# Patient Record
Sex: Male | Born: 1975 | Race: White | Hispanic: No | Marital: Single | Smoking: Light tobacco smoker
Health system: Southern US, Community
[De-identification: ages and names within clinical notes are randomized; demographics above are authoritative.]

## PROBLEM LIST (undated history)

## (undated) DIAGNOSIS — F17200 Nicotine dependence, unspecified, uncomplicated: Secondary | ICD-10-CM

## (undated) DIAGNOSIS — S82143A Displaced bicondylar fracture of unspecified tibia, initial encounter for closed fracture: Secondary | ICD-10-CM

## (undated) DIAGNOSIS — E349 Endocrine disorder, unspecified: Secondary | ICD-10-CM

## (undated) DIAGNOSIS — F1910 Other psychoactive substance abuse, uncomplicated: Secondary | ICD-10-CM

## (undated) DIAGNOSIS — E611 Iron deficiency: Secondary | ICD-10-CM

## (undated) DIAGNOSIS — E559 Vitamin D deficiency, unspecified: Secondary | ICD-10-CM

## (undated) HISTORY — PX: FRACTURE SURGERY: SHX138

---

## 1998-09-01 ENCOUNTER — Inpatient Hospital Stay (HOSPITAL_COMMUNITY): Admission: EM | Admit: 1998-09-01 | Discharge: 1998-09-16 | Payer: Self-pay | Admitting: Emergency Medicine

## 1998-09-01 ENCOUNTER — Encounter: Payer: Self-pay | Admitting: General Surgery

## 1998-09-01 ENCOUNTER — Encounter: Payer: Self-pay | Admitting: Emergency Medicine

## 1998-09-02 ENCOUNTER — Encounter: Payer: Self-pay | Admitting: General Surgery

## 1998-09-06 ENCOUNTER — Encounter: Payer: Self-pay | Admitting: General Surgery

## 1998-09-09 ENCOUNTER — Encounter: Payer: Self-pay | Admitting: General Surgery

## 1998-09-13 ENCOUNTER — Encounter: Payer: Self-pay | Admitting: *Deleted

## 1998-09-14 ENCOUNTER — Encounter: Payer: Self-pay | Admitting: General Surgery

## 1998-12-29 ENCOUNTER — Ambulatory Visit (HOSPITAL_COMMUNITY): Admission: RE | Admit: 1998-12-29 | Discharge: 1998-12-29 | Payer: Self-pay | Admitting: *Deleted

## 1998-12-29 ENCOUNTER — Encounter: Payer: Self-pay | Admitting: *Deleted

## 2005-04-22 ENCOUNTER — Emergency Department (HOSPITAL_COMMUNITY): Admission: EM | Admit: 2005-04-22 | Discharge: 2005-04-22 | Payer: Self-pay | Admitting: Emergency Medicine

## 2006-08-11 ENCOUNTER — Emergency Department (HOSPITAL_COMMUNITY): Admission: EM | Admit: 2006-08-11 | Discharge: 2006-08-11 | Payer: Self-pay | Admitting: Emergency Medicine

## 2006-08-12 ENCOUNTER — Inpatient Hospital Stay (HOSPITAL_COMMUNITY): Admission: EM | Admit: 2006-08-12 | Discharge: 2006-08-15 | Payer: Self-pay | Admitting: Emergency Medicine

## 2012-03-03 ENCOUNTER — Ambulatory Visit (INDEPENDENT_AMBULATORY_CARE_PROVIDER_SITE_OTHER): Payer: No Typology Code available for payment source | Admitting: Family Medicine

## 2012-03-03 ENCOUNTER — Ambulatory Visit: Payer: No Typology Code available for payment source

## 2012-03-03 VITALS — BP 144/92 | HR 104 | Temp 102.0°F | Resp 20 | Ht 74.5 in | Wt 170.0 lb

## 2012-03-03 DIAGNOSIS — R1011 Right upper quadrant pain: Secondary | ICD-10-CM

## 2012-03-03 DIAGNOSIS — M25519 Pain in unspecified shoulder: Secondary | ICD-10-CM

## 2012-03-03 DIAGNOSIS — D72829 Elevated white blood cell count, unspecified: Secondary | ICD-10-CM

## 2012-03-03 DIAGNOSIS — R509 Fever, unspecified: Secondary | ICD-10-CM

## 2012-03-03 LAB — POCT CBC
Granulocyte percent: 78.8 %G (ref 37–80)
HCT, POC: 52.8 % (ref 43.5–53.7)
Hemoglobin: 16.9 g/dL (ref 14.1–18.1)
Lymph, poc: 1.5 (ref 0.6–3.4)
MCH, POC: 30.8 pg (ref 27–31.2)
MCHC: 32 g/dL (ref 31.8–35.4)
MCV: 96.3 fL (ref 80–97)
MID (cbc): 1 — AB (ref 0–0.9)
MPV: 7.9 fL (ref 0–99.8)
POC Granulocyte: 9.2 — AB (ref 2–6.9)
POC LYMPH PERCENT: 12.5 % (ref 10–50)
POC MID %: 8.7 %M (ref 0–12)
Platelet Count, POC: 298 10*3/uL (ref 142–424)
RBC: 5.48 M/uL (ref 4.69–6.13)
RDW, POC: 12 %
WBC: 11.7 10*3/uL — AB (ref 4.6–10.2)

## 2012-03-03 LAB — POCT URINALYSIS DIPSTICK
Bilirubin, UA: NEGATIVE
Glucose, UA: NEGATIVE
Ketones, UA: NEGATIVE
Leukocytes, UA: NEGATIVE
Nitrite, UA: NEGATIVE
Protein, UA: 30
Spec Grav, UA: 1.025
Urobilinogen, UA: 1
pH, UA: 5.5

## 2012-03-03 LAB — POCT UA - MICROSCOPIC ONLY
Bacteria, U Microscopic: NEGATIVE
Casts, Ur, LPF, POC: NEGATIVE
Crystals, Ur, HPF, POC: NEGATIVE
Epithelial cells, urine per micros: NEGATIVE
Mucus, UA: NEGATIVE
RBC, urine, microscopic: NEGATIVE
WBC, Ur, HPF, POC: NEGATIVE
Yeast, UA: NEGATIVE

## 2012-03-03 MED ORDER — CIPROFLOXACIN HCL 500 MG PO TABS: 500.0000 mg | ORAL_TABLET | Freq: Two times a day (BID) | ORAL | Status: DC

## 2012-03-03 MED ORDER — MELOXICAM 7.5 MG PO TABS: 7.5000 mg | ORAL_TABLET | Freq: Every day | ORAL | Status: DC

## 2012-03-03 MED ORDER — CYCLOBENZAPRINE HCL 10 MG PO TABS: 10.0000 mg | ORAL_TABLET | Freq: Two times a day (BID) | ORAL | Status: DC | PRN

## 2012-03-03 NOTE — Progress Notes (Signed)
 [facility]:  Office Visit  Chief Complaint:  Chief Complaint  Patient presents with  . Rt shoulder/side pain    X15 days    HPI: Jesse Mcintosh is a 36 y.o. male who complains of  11-12 day history of right upper to mid back, abdominal pain, flank pain. No other sxs. Deneis h/o renal stones. Bale to functiona nd eat normally. No night sweat, weight loss. No n/v/ diarrhea, blood in stool or urine. Fever started last night. Has not traveled anywhere, no new meds. No new foods. No urethral dc.  History reviewed. No pertinent past medical history. Past Surgical History  Procedure Date  . Fracture surgery    History   Social History  . Marital Status: Single    Spouse Name: N/A    Number of Children: N/A  . Years of Education: N/A   Social History Main Topics  . Smoking status: Never Smoker   . Smokeless tobacco: None  . Alcohol Use: 1.5 oz/week    3 drink(s) per week  . Drug Use: Yes    Special: Marijuana, Amphetamines  . Sexually Active: None   Other Topics Concern  . None   Social History Narrative  . None   History reviewed. No pertinent family history. Not on File Prior to Admission medications   Not on File     ROS: The patient denies fevers, chills, night sweats, unintentional weight loss, chest pain, palpitations, wheezing, dyspnea on exertion, nausea, vomiting, dysuria, hematuria, melena, numbness, weakness, or tingling.   All other systems have been reviewed and were otherwise negative with the exception of those mentioned in the HPI and as above.    PHYSICAL EXAM: Filed Vitals:   03/03/12 1138  BP: 144/92  Pulse: 104  Temp: 102 F (38.9 C)  Resp: 20   Filed Vitals:   03/03/12 1138  Height: 6' 2.5" (1.892 m)  Weight: 170 lb (77.111 kg)   Body mass index is 21.53 kg/(m^2).  General: Alert, no acute distress HEENT:  Normocephalic, atraumatic, oropharynx patent.  Cardiovascular:  Regular rate and rhythm, no rubs murmurs or  gallops.  No Carotid bruits, radial pulse intact. No pedal edema.  Respiratory: Clear to auscultation bilaterally.  No wheezes, rales, or rhonchi.  No cyanosis, no use of accessory musculature GI: No organomegaly, abdomen is soft and minimall-tender Right upper mid abd quadrant, positive bowel sounds.  No masses. No guarding, no rebound Skin: No rashes. Neurologic: Facial musculature symmetric. Psychiatric: Patient is appropriate throughout our interaction. Lymphatic: No cervical lymphadenopathy Musculoskeletal: Gait intact. Right shoulder-AC jt tenderness, decrease flexion , ER and abduction. 5/5 strength, 2/2 DTRs, sensation intact.  Neck-nl exam   LABS: Results for orders placed in visit on 03/03/12  POCT CBC      Component Value Range   WBC 11.7 (*) 4.6 - 10.2 K/uL   Lymph, poc 1.5  0.6 - 3.4   POC LYMPH PERCENT 12.5  10 - 50 %L   MID (cbc) 1.0 (*) 0 - 0.9   POC MID % 8.7  0 - 12 %M   POC Granulocyte 9.2 (*) 2 - 6.9   Granulocyte percent 78.8  37 - 80 %G   RBC 5.48  4.69 - 6.13 M/uL   Hemoglobin 16.9  14.1 - 18.1 g/dL   HCT, POC 16.1  09.6 - 53.7 %   MCV 96.3  80 - 97 fL   MCH, POC 30.8  27 - 31.2 pg   MCHC  32.0  31.8 - 35.4 g/dL   RDW, POC 14.7     Platelet Count, POC 298  142 - 424 K/uL   MPV 7.9  0 - 99.8 fL  POCT UA - MICROSCOPIC ONLY      Component Value Range   WBC, Ur, HPF, POC neg     RBC, urine, microscopic neg     Bacteria, U Microscopic neg     Mucus, UA neg     Epithelial cells, urine per micros neg     Crystals, Ur, HPF, POC neg     Casts, Ur, LPF, POC neg     Yeast, UA neg    POCT URINALYSIS DIPSTICK      Component Value Range   Color, UA yellow     Clarity, UA clear     Glucose, UA neg     Bilirubin, UA neg     Ketones, UA neg     Spec Grav, UA 1.025     Blood, UA trace-intact     pH, UA 5.5     Protein, UA 30     Urobilinogen, UA 1.0     Nitrite, UA neg     Leukocytes, UA Negative       EKG/XRAY:   Primary read interpreted by Dr. Conley Rolls at  [facility]. No acute cardiopulm process, no free air, no obstruction   ASSESSMENT/PLAN: Encounter Diagnoses  Name Primary?  . Abdominal pain, acute, right upper quadrant Yes  . Fever    I rechecked his temp and it was 99.8 Last drug use 1 week ago, H/o alcohol excess Will await CMP, amylase, lipase Will get urine cx Will treat for cipro 500 mg BID x 10 days  ? Etiology Renal stones vs UTI vs smoldering appendix vs less likely gallbaldder or pancreatic issues,  however he has had this for over 11-12 days and it has been tolerable. He has a small white count I cannot explain but will presumptively treat with Cipro. Urine cx pending. ? Shoulder pain related to [facility] jt impingment based on clinical exam. Rx Mobic and Flexeril. No xrays today, has AC tenderness, has problems with ER but all other shoulder exam nl. If he has worsening sxs advise to go to ER and or call [facility]    ,  PHUONG, DO 03/03/2012 12:33 PM

## 2012-03-04 ENCOUNTER — Telehealth: Payer: Self-pay | Admitting: Radiology

## 2012-03-04 ENCOUNTER — Telehealth: Payer: Self-pay | Admitting: Family Medicine

## 2012-03-04 LAB — COMPREHENSIVE METABOLIC PANEL
ALT: 36 U/L (ref 0–53)
AST: 22 U/L (ref 0–37)
Calcium: 9.4 mg/dL (ref 8.4–10.5)
Chloride: 99 mEq/L (ref 96–112)
Creat: 1.29 mg/dL (ref 0.50–1.35)
Potassium: 4.4 mEq/L (ref 3.5–5.3)
Sodium: 137 mEq/L (ref 135–145)

## 2012-03-04 LAB — COMPREHENSIVE METABOLIC PANEL WITH GFR
Albumin: 4.5 g/dL (ref 3.5–5.2)
Alkaline Phosphatase: 67 U/L (ref 39–117)
BUN: 10 mg/dL (ref 6–23)
CO2: 27 meq/L (ref 19–32)
Glucose, Bld: 88 mg/dL (ref 70–99)
Total Bilirubin: 1.3 mg/dL — ABNORMAL HIGH (ref 0.3–1.2)
Total Protein: 7.2 g/dL (ref 6.0–8.3)

## 2012-03-04 LAB — LIPASE: Lipase: 49 U/L (ref 0–75)

## 2012-03-04 LAB — AMYLASE: Amylase: 44 U/L (ref 0–105)

## 2012-03-04 NOTE — Telephone Encounter (Signed)
Patients father called back, states we can call patient at # [phone], he got message on his phone, which is only number in chart. He was angry we called him about his son. He states it is probably concerning a bill. I advised, more than likely Dr Conley Rolls was checking on patient. He wants [place] to call him at this other number. I asked how patient was feeling, he states he does not know. Did you call patient?

## 2012-03-04 NOTE — Telephone Encounter (Signed)
LM to call us back about his labs. So far so good. Slighlty elevated t bili, otherwise kidneys, liver, and pancreas alllook good. Still waiting for urine cx. If he has worsening pains with foods, nause aor vomiting then let us know. This may be a picture of early gall bladder issues or just small renal stones vs less likely appendix.  Continue with Cipro until I get urine cx results

## 2012-03-05 LAB — URINE CULTURE
Colony Count: NO GROWTH
Organism ID, Bacteria: NO GROWTH

## 2012-03-06 NOTE — Telephone Encounter (Signed)
Spoke to patient about labs, feeling better, fever broke, pain is gone. Continue with abx, and prn flexeril and mobic

## 2014-06-19 ENCOUNTER — Inpatient Hospital Stay (HOSPITAL_COMMUNITY): Payer: Self-pay

## 2014-06-19 ENCOUNTER — Inpatient Hospital Stay (HOSPITAL_COMMUNITY)
Admission: EM | Admit: 2014-06-19 | Discharge: 2014-06-25 | DRG: 493 | Payer: Self-pay | Attending: Orthopedic Surgery | Admitting: Orthopedic Surgery

## 2014-06-19 ENCOUNTER — Encounter (HOSPITAL_COMMUNITY): Payer: Self-pay | Admitting: Emergency Medicine

## 2014-06-19 ENCOUNTER — Emergency Department (HOSPITAL_COMMUNITY): Payer: Self-pay

## 2014-06-19 DIAGNOSIS — Z9889 Other specified postprocedural states: Secondary | ICD-10-CM

## 2014-06-19 DIAGNOSIS — E349 Endocrine disorder, unspecified: Secondary | ICD-10-CM | POA: Diagnosis present

## 2014-06-19 DIAGNOSIS — Z01818 Encounter for other preprocedural examination: Secondary | ICD-10-CM

## 2014-06-19 DIAGNOSIS — F1910 Other psychoactive substance abuse, uncomplicated: Secondary | ICD-10-CM | POA: Diagnosis present

## 2014-06-19 DIAGNOSIS — S82143A Displaced bicondylar fracture of unspecified tibia, initial encounter for closed fracture: Secondary | ICD-10-CM

## 2014-06-19 DIAGNOSIS — E291 Testicular hypofunction: Secondary | ICD-10-CM | POA: Diagnosis present

## 2014-06-19 DIAGNOSIS — E611 Iron deficiency: Secondary | ICD-10-CM | POA: Diagnosis present

## 2014-06-19 DIAGNOSIS — D62 Acute posthemorrhagic anemia: Secondary | ICD-10-CM | POA: Diagnosis not present

## 2014-06-19 DIAGNOSIS — Y92410 Unspecified street and highway as the place of occurrence of the external cause: Secondary | ICD-10-CM

## 2014-06-19 DIAGNOSIS — Z419 Encounter for procedure for purposes other than remedying health state, unspecified: Secondary | ICD-10-CM

## 2014-06-19 DIAGNOSIS — S82142A Displaced bicondylar fracture of left tibia, initial encounter for closed fracture: Principal | ICD-10-CM | POA: Diagnosis present

## 2014-06-19 DIAGNOSIS — S82832A Other fracture of upper and lower end of left fibula, initial encounter for closed fracture: Secondary | ICD-10-CM

## 2014-06-19 DIAGNOSIS — R52 Pain, unspecified: Secondary | ICD-10-CM

## 2014-06-19 DIAGNOSIS — F17210 Nicotine dependence, cigarettes, uncomplicated: Secondary | ICD-10-CM | POA: Diagnosis present

## 2014-06-19 DIAGNOSIS — F17200 Nicotine dependence, unspecified, uncomplicated: Secondary | ICD-10-CM | POA: Diagnosis present

## 2014-06-19 DIAGNOSIS — E559 Vitamin D deficiency, unspecified: Secondary | ICD-10-CM | POA: Diagnosis present

## 2014-06-19 HISTORY — DX: Displaced bicondylar fracture of unspecified tibia, initial encounter for closed fracture: S82.143A

## 2014-06-19 HISTORY — DX: Iron deficiency: E61.1

## 2014-06-19 HISTORY — DX: Endocrine disorder, unspecified: E34.9

## 2014-06-19 HISTORY — DX: Nicotine dependence, unspecified, uncomplicated: F17.200

## 2014-06-19 HISTORY — DX: Vitamin D deficiency, unspecified: E55.9

## 2014-06-19 HISTORY — DX: Other psychoactive substance abuse, uncomplicated: F19.10
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
## 2014-06-19 NOTE — ED Notes (Signed)
Per [place] [employer], patient presently has warrants against him.  If Pt wishes to leave then Nursing is to summon off duty police so that said warrants can be served.  Per [employer] pt is to be medically treated and then they will execute warrants.

## 2014-06-20 ENCOUNTER — Encounter (HOSPITAL_COMMUNITY): Payer: Self-pay | Admitting: *Deleted

## 2014-06-20 LAB — SURGICAL PCR SCREEN
MRSA, PCR: NEGATIVE
STAPHYLOCOCCUS AUREUS: POSITIVE — AB

## 2014-06-20 MED ORDER — ACETAMINOPHEN 500 MG PO TABS
1000.0000 mg | ORAL_TABLET | Freq: Three times a day (TID) | ORAL | Status: DC
  Administered 2014-06-20 – 2014-06-22 (×5): 1000 mg via ORAL
  Filled 2014-06-20 (×8): qty 2

## 2014-06-20 MED ORDER — HYDROCODONE-ACETAMINOPHEN 5-325 MG PO TABS
1.0000 | ORAL_TABLET | ORAL | Status: DC | PRN
  Administered 2014-06-20 (×2): 2 via ORAL
  Filled 2014-06-20 (×2): qty 2

## 2014-06-20 MED ORDER — SODIUM CHLORIDE 0.9 % IV SOLN
INTRAVENOUS | Status: DC
  Administered 2014-06-20 – 2014-06-22 (×2): via INTRAVENOUS

## 2014-06-20 MED ORDER — ONDANSETRON HCL 4 MG/2ML IJ SOLN: 4.0000 mg | Freq: Four times a day (QID) | INTRAMUSCULAR | Status: DC | PRN

## 2014-06-20 MED ORDER — OXYCODONE HCL 5 MG PO TABS
5.0000 mg | ORAL_TABLET | ORAL | Status: DC | PRN
  Administered 2014-06-20 – 2014-06-21 (×4): 15 mg via ORAL
  Administered 2014-06-21 – 2014-06-22 (×4): 10 mg via ORAL
  Administered 2014-06-23 (×2): 15 mg via ORAL
  Administered 2014-06-23: 5 mg via ORAL
  Administered 2014-06-23: 15 mg via ORAL
  Administered 2014-06-23: 10 mg via ORAL
  Administered 2014-06-23: 15 mg via ORAL
  Administered 2014-06-23: 5 mg via ORAL
  Administered 2014-06-24 (×3): 10 mg via ORAL
  Administered 2014-06-25: 15 mg via ORAL
  Administered 2014-06-25: 10 mg via ORAL
  Filled 2014-06-20: qty 3
  Filled 2014-06-20: qty 2
  Filled 2014-06-20: qty 3
  Filled 2014-06-20 (×3): qty 2
  Filled 2014-06-20: qty 3
  Filled 2014-06-20 (×2): qty 2
  Filled 2014-06-20: qty 3
  Filled 2014-06-20 (×4): qty 2
  Filled 2014-06-20 (×3): qty 3
  Filled 2014-06-20: qty 2
  Filled 2014-06-20 (×2): qty 3

## 2014-06-20 MED ORDER — ONDANSETRON HCL 4 MG/2ML IJ SOLN: 4.0000 mg | Freq: Three times a day (TID) | INTRAMUSCULAR | Status: AC | PRN

## 2014-06-20 MED ORDER — CHLORHEXIDINE GLUCONATE CLOTH 2 % EX PADS
6.0000 | MEDICATED_PAD | Freq: Every day | CUTANEOUS | Status: AC
  Administered 2014-06-20 – 2014-06-24 (×3): 6 via TOPICAL

## 2014-06-20 MED ORDER — ALPRAZOLAM 0.5 MG PO TABS
0.5000 mg | ORAL_TABLET | Freq: Three times a day (TID) | ORAL | Status: DC | PRN
  Administered 2014-06-20 – 2014-06-22 (×4): 0.5 mg via ORAL
  Filled 2014-06-20 (×4): qty 1

## 2014-06-20 MED ORDER — SODIUM CHLORIDE 0.9 % IV SOLN
INTRAVENOUS | Status: AC
  Administered 2014-06-20: 01:00:00 via INTRAVENOUS

## 2014-06-20 MED ORDER — DIPHENHYDRAMINE HCL 12.5 MG/5ML PO ELIX
25.0000 mg | ORAL_SOLUTION | ORAL | Status: DC | PRN
Start: 2014-06-20 — End: 2014-06-25
  Administered 2014-06-20 – 2014-06-24 (×8): 25 mg via ORAL
  Filled 2014-06-20 (×8): qty 10

## 2014-06-20 MED ORDER — HYDROMORPHONE HCL 1 MG/ML IJ SOLN
1.0000 mg | INTRAMUSCULAR | Status: AC | PRN
  Administered 2014-06-20 (×5): 1 mg via INTRAVENOUS
  Filled 2014-06-20 (×6): qty 1

## 2014-06-20 MED ORDER — HYDROMORPHONE HCL 1 MG/ML IJ SOLN
1.0000 mg | INTRAMUSCULAR | Status: DC | PRN
  Administered 2014-06-20 – 2014-06-22 (×8): 1 mg via INTRAVENOUS
  Filled 2014-06-20 (×9): qty 1

## 2014-06-20 MED ORDER — MUPIROCIN 2 % EX OINT
1.0000 "application " | TOPICAL_OINTMENT | Freq: Two times a day (BID) | CUTANEOUS | Status: AC
  Administered 2014-06-20 – 2014-06-24 (×5): 1 via NASAL
  Filled 2014-06-20 (×4): qty 22

## 2014-06-20 NOTE — Progress Notes (Signed)
Patient ID: [name], male   DOB: Oct 30, 1975, 39 y.o.   MRN: [id number]  Full consult note to follow  Left significant lateral split/depressed tibial plateau fracture  Will discuss with Dr. Carola FrostHandy for definitive treatment  Importantly, calf muscles soft Pain in knee area with passive stretch Change pain meds No chemo-prophylactic anticoagulation due to concerns for fracture bleeding SCD to right leg NWB LLE PT eval ordered

## 2014-06-20 NOTE — Progress Notes (Signed)
Pt started on protocol for positive PCR Staph Aureus.
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
## 2014-06-21 ENCOUNTER — Inpatient Hospital Stay (HOSPITAL_COMMUNITY): Payer: Self-pay

## 2014-06-21 ENCOUNTER — Encounter (HOSPITAL_COMMUNITY): Payer: Self-pay | Admitting: Orthopedic Surgery

## 2014-06-21 LAB — COMPREHENSIVE METABOLIC PANEL
ALBUMIN: 4 g/dL (ref 3.5–5.2)
ALT: 30 U/L (ref 0–53)
AST: 22 U/L (ref 0–37)
Alkaline Phosphatase: 56 U/L (ref 39–117)
Anion gap: 4 — ABNORMAL LOW (ref 5–15)
BUN: 7 mg/dL (ref 6–23)
CO2: 27 mmol/L (ref 19–32)
Calcium: 8.4 mg/dL (ref 8.4–10.5)
Chloride: 109 mmol/L (ref 96–112)
Creatinine, Ser: 0.96 mg/dL (ref 0.50–1.35)
GFR calc non Af Amer: >90 mL/min (ref >90)
GLUCOSE: 115 mg/dL — AB (ref 70–99)
POTASSIUM: 3.9 mmol/L (ref 3.5–5.1)
Sodium: 140 mmol/L (ref 135–145)
Total Bilirubin: 1 mg/dL (ref 0.3–1.2)
Total Protein: 7 g/dL (ref 6.0–8.3)

## 2014-06-21 LAB — CBC WITH DIFFERENTIAL/PLATELET
Basophils Absolute: 0 10*3/uL (ref 0.0–0.1)
Basophils Relative: 0 % (ref 0–1)
EOS PCT: 2 % (ref 0–5)
Eosinophils Absolute: 0.2 10*3/uL (ref 0.0–0.7)
HCT: 40.7 % (ref 39.0–52.0)
Hemoglobin: 14.1 g/dL (ref 13.0–17.0)
Lymphocytes Relative: 18 % (ref 12–46)
Lymphs Abs: 1.6 10*3/uL (ref 0.7–4.0)
MCH: 32.3 pg (ref 26.0–34.0)
MCHC: 34.6 g/dL (ref 30.0–36.0)
MCV: 93.3 fL (ref 78.0–100.0)
Monocytes Absolute: 0.9 10*3/uL (ref 0.1–1.0)
Monocytes Relative: 10 % (ref 3–12)
Neutro Abs: 6 10*3/uL (ref 1.7–7.7)
Neutrophils Relative %: 70 % (ref 43–77)
Platelets: 222 10*3/uL (ref 150–400)
RBC: 4.36 MIL/uL (ref 4.22–5.81)
RDW: 12 % (ref 11.5–15.5)
WBC: 8.7 10*3/uL (ref 4.0–10.5)

## 2014-06-21 LAB — URINALYSIS, ROUTINE W REFLEX MICROSCOPIC
Bilirubin Urine: NEGATIVE
Glucose, UA: NEGATIVE mg/dL
Hgb urine dipstick: NEGATIVE
Ketones, ur: NEGATIVE mg/dL
LEUKOCYTES UA: NEGATIVE
NITRITE: NEGATIVE
PH: 5.5 (ref 5.0–8.0)
Protein, ur: NEGATIVE mg/dL
Specific Gravity, Urine: 1.005 (ref 1.005–1.030)
Urobilinogen, UA: 0.2 mg/dL (ref 0.0–1.0)

## 2014-06-21 LAB — RAPID URINE DRUG SCREEN, HOSP PERFORMED
Amphetamines: NOT DETECTED
Barbiturates: NOT DETECTED
Benzodiazepines: NOT DETECTED
COCAINE: POSITIVE — AB
OPIATES: POSITIVE — AB
Tetrahydrocannabinol: NOT DETECTED

## 2014-06-21 MED ORDER — OXYCODONE-ACETAMINOPHEN 5-325 MG PO TABS
1.0000 | ORAL_TABLET | Freq: Four times a day (QID) | ORAL | Status: DC | PRN
  Administered 2014-06-21 – 2014-06-25 (×10): 2 via ORAL
  Filled 2014-06-21 (×10): qty 2

## 2014-06-21 MED ORDER — METHOCARBAMOL 500 MG PO TABS
1000.0000 mg | ORAL_TABLET | Freq: Four times a day (QID) | ORAL | Status: DC | PRN
  Administered 2014-06-22: 1000 mg via ORAL
  Filled 2014-06-21: qty 2

## 2014-06-21 MED ORDER — CEFAZOLIN SODIUM-DEXTROSE 2-3 GM-% IV SOLR
2.0000 g | Freq: Once | INTRAVENOUS | Status: AC
  Administered 2014-06-22: 2 g via INTRAVENOUS
  Filled 2014-06-21: qty 50

## 2014-06-21 NOTE — Progress Notes (Signed)
Report called [facility] for room 5N26. All questions answered. Pt left with [facility] in NAD.

## 2014-06-21 NOTE — Progress Notes (Signed)
Received orders to have pt [facility]. Received room number for pt. [facility] contacted and is to be transferring pt when they have a truck available to do so. Pt aware of these changes and is understand and willing to allow these to take place. All questions answered for pt, and pt has ordered breakfast as per order from PA.

## 2014-06-21 NOTE — Progress Notes (Signed)
PT Cancellation Note  Patient Details Name: Jesse Mcintosh MRN: [id number] DOB: 26-Sep-1975   Cancelled Treatment:    Reason Eval/Treat Not Completed: Patient at procedure or test/unavailable ([facility]   Ralene BatheUhlenberg, Shaquella Stamant Kistler 06/21/2014, 10:45 AM [phone]

## 2014-06-21 NOTE — Progress Notes (Signed)
Patient ID: Jesse Mcintosh, male   DOB: 05/15/1975, 39 y.o.   MRN: [id number]  Have reviewed case, X-rays and CT with Dr. Carola FrostHandy He agrees that surgical indicated and plans to take him to the OR pending skin evaluation this week He will need to be [facility], [facility] today  No major complaints other than obvious pain in knee  Left calf soft, no pain with passive motion of ankle No sensory loss  [facility] for [facility] to address definitively

## 2014-06-21 NOTE — Progress Notes (Signed)
Orthopedic Tech Progress Note Patient Details:  Mellody DanceFred L Mozer 11-Apr-1976 [id number] Applied Watson-Jones dressing to LLE.  Pulses, sensation, motion intact before and after application.  Capillary refill less than 2 seconds before and after application. Ortho Devices Type of Ortho Device: Lenora BoysWatson Jones splint Ortho Device/Splint Location: LLE Ortho Device/Splint Interventions: Application   Lesle ChrisGilliland, Gisella Alwine L 06/21/2014, 3:38 PM
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
## 2014-06-21 NOTE — Progress Notes (Signed)
Utilization Review Completed.Jesse Mcintosh T2/22/2016  
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
## 2014-06-22 ENCOUNTER — Inpatient Hospital Stay (HOSPITAL_COMMUNITY): Payer: Self-pay

## 2014-06-22 ENCOUNTER — Encounter (HOSPITAL_COMMUNITY): Admission: EM | Payer: Self-pay | Source: Home / Self Care | Attending: Orthopedic Surgery

## 2014-06-22 ENCOUNTER — Inpatient Hospital Stay (HOSPITAL_COMMUNITY): Payer: Self-pay | Admitting: Anesthesiology

## 2014-06-22 ENCOUNTER — Inpatient Hospital Stay (HOSPITAL_COMMUNITY): Payer: MEDICAID | Admitting: Anesthesiology

## 2014-06-22 HISTORY — PX: ORIF TIBIA PLATEAU: SHX2132

## 2014-06-22 LAB — CBC
HCT: 39 % (ref 39.0–52.0)
Hemoglobin: 13.5 g/dL (ref 13.0–17.0)
MCH: 31.7 pg (ref 26.0–34.0)
MCHC: 34.6 g/dL (ref 30.0–36.0)
MCV: 91.5 fL (ref 78.0–100.0)
Platelets: 211 10*3/uL (ref 150–400)
RBC: 4.26 MIL/uL (ref 4.22–5.81)
RDW: 12.1 % (ref 11.5–15.5)
WBC: 6.3 10*3/uL (ref 4.0–10.5)

## 2014-06-22 LAB — PROTIME-INR
INR: 0.99 (ref 0.00–1.49)
PROTHROMBIN TIME: 13.2 s (ref 11.6–15.2)

## 2014-06-22 LAB — ABO/RH: ABO/RH(D): B NEG

## 2014-06-22 LAB — TYPE AND SCREEN
ABO/RH(D): B NEG
ANTIBODY SCREEN: NEGATIVE

## 2014-06-22 LAB — HEPATITIS PANEL, ACUTE
HCV Ab: NEGATIVE
HEP B S AG: NEGATIVE
Hep A IgM: NONREACTIVE
Hep B C IgM: NONREACTIVE

## 2014-06-22 LAB — APTT: aPTT: 30 seconds (ref 24–37)

## 2014-06-22 LAB — VITAMIN D 25 HYDROXY (VIT D DEFICIENCY, FRACTURES): Vit D, 25-Hydroxy: 10.3 ng/mL — ABNORMAL LOW (ref 30.0–100.0)

## 2014-06-22 SURGERY — OPEN REDUCTION INTERNAL FIXATION (ORIF) TIBIAL PLATEAU
Anesthesia: General | Laterality: Left

## 2014-06-22 MED ORDER — PROMETHAZINE HCL 25 MG/ML IJ SOLN: 6.2500 mg | INTRAMUSCULAR | Status: DC | PRN

## 2014-06-22 MED ORDER — BISACODYL 5 MG PO TBEC: 5.0000 mg | DELAYED_RELEASE_TABLET | Freq: Every day | ORAL | Status: DC | PRN

## 2014-06-22 MED ORDER — MIDAZOLAM HCL 5 MG/5ML IJ SOLN
INTRAMUSCULAR | Status: DC | PRN
  Administered 2014-06-22: 2 mg via INTRAVENOUS

## 2014-06-22 MED ORDER — LIDOCAINE HCL (CARDIAC) 20 MG/ML IV SOLN
INTRAVENOUS | Status: DC | PRN
  Administered 2014-06-22: 50 mg via INTRAVENOUS

## 2014-06-22 MED ORDER — PROPOFOL 10 MG/ML IV BOLUS
INTRAVENOUS | Status: DC | PRN
  Administered 2014-06-22: 200 mg via INTRAVENOUS

## 2014-06-22 MED ORDER — METHOCARBAMOL 500 MG PO TABS
1000.0000 mg | ORAL_TABLET | Freq: Four times a day (QID) | ORAL | Status: DC | PRN
  Administered 2014-06-22 – 2014-06-23 (×2): 1000 mg via ORAL
  Filled 2014-06-22 (×2): qty 2

## 2014-06-22 MED ORDER — MAGNESIUM CITRATE PO SOLN: 1.0000 | Freq: Once | ORAL | Status: AC | PRN

## 2014-06-22 MED ORDER — HYDROMORPHONE HCL 1 MG/ML IJ SOLN
1.0000 mg | INTRAMUSCULAR | Status: DC | PRN
  Administered 2014-06-22 – 2014-06-23 (×4): 1 mg via INTRAVENOUS
  Filled 2014-06-22 (×4): qty 1

## 2014-06-22 MED ORDER — LACTATED RINGERS IV SOLN
INTRAVENOUS | Status: DC | PRN
  Administered 2014-06-22 (×2): via INTRAVENOUS

## 2014-06-22 MED ORDER — LACTATED RINGERS IV SOLN
INTRAVENOUS | Status: DC
  Administered 2014-06-22: 11:00:00 via INTRAVENOUS

## 2014-06-22 MED ORDER — PROPOFOL 10 MG/ML IV BOLUS
INTRAVENOUS | Status: AC
  Filled 2014-06-22: qty 20

## 2014-06-22 MED ORDER — THIAMINE HCL 100 MG/ML IJ SOLN
100.0000 mg | Freq: Every day | INTRAMUSCULAR | Status: DC
  Filled 2014-06-22 (×3): qty 1

## 2014-06-22 MED ORDER — METOCLOPRAMIDE HCL 5 MG/ML IJ SOLN: 5.0000 mg | Freq: Three times a day (TID) | INTRAMUSCULAR | Status: DC | PRN

## 2014-06-22 MED ORDER — ADULT MULTIVITAMIN W/MINERALS CH
1.0000 | ORAL_TABLET | Freq: Every day | ORAL | Status: DC
  Administered 2014-06-23 – 2014-06-25 (×3): 1 via ORAL
  Filled 2014-06-22 (×3): qty 1

## 2014-06-22 MED ORDER — MIDAZOLAM HCL 2 MG/2ML IJ SOLN
INTRAMUSCULAR | Status: AC
  Filled 2014-06-22: qty 2

## 2014-06-22 MED ORDER — FENTANYL CITRATE 0.05 MG/ML IJ SOLN
INTRAMUSCULAR | Status: DC | PRN
  Administered 2014-06-22: 150 ug via INTRAVENOUS
  Administered 2014-06-22: 50 ug via INTRAVENOUS
  Administered 2014-06-22: 100 ug via INTRAVENOUS
  Administered 2014-06-22 (×2): 50 ug via INTRAVENOUS

## 2014-06-22 MED ORDER — LORAZEPAM 1 MG PO TABS
0.0000 mg | ORAL_TABLET | Freq: Four times a day (QID) | ORAL | Status: AC
Start: 2014-06-22 — End: 2014-06-24
  Administered 2014-06-22: 2 mg via ORAL
  Administered 2014-06-23: 1 mg via ORAL
  Administered 2014-06-23: 2 mg via ORAL
  Filled 2014-06-22: qty 2
  Filled 2014-06-22: qty 1
  Filled 2014-06-22: qty 2

## 2014-06-22 MED ORDER — VITAMIN C 500 MG PO TABS
1000.0000 mg | ORAL_TABLET | Freq: Every day | ORAL | Status: DC
  Administered 2014-06-22 – 2014-06-25 (×4): 1000 mg via ORAL
  Filled 2014-06-22 (×4): qty 2

## 2014-06-22 MED ORDER — HYDROMORPHONE HCL 1 MG/ML IJ SOLN
INTRAMUSCULAR | Status: AC
  Administered 2014-06-22: 0.5 mg via INTRAVENOUS
  Filled 2014-06-22: qty 1

## 2014-06-22 MED ORDER — ONDANSETRON HCL 4 MG PO TABS: 4.0000 mg | ORAL_TABLET | Freq: Four times a day (QID) | ORAL | Status: DC | PRN

## 2014-06-22 MED ORDER — 0.9 % SODIUM CHLORIDE (POUR BTL) OPTIME
TOPICAL | Status: DC | PRN
  Administered 2014-06-22: 1000 mL

## 2014-06-22 MED ORDER — ONDANSETRON HCL 4 MG/2ML IJ SOLN
INTRAMUSCULAR | Status: AC
  Filled 2014-06-22: qty 2

## 2014-06-22 MED ORDER — FENTANYL CITRATE 0.05 MG/ML IJ SOLN
INTRAMUSCULAR | Status: AC
  Filled 2014-06-22: qty 5

## 2014-06-22 MED ORDER — MEPERIDINE HCL 25 MG/ML IJ SOLN: 6.2500 mg | INTRAMUSCULAR | Status: DC | PRN

## 2014-06-22 MED ORDER — HYDROMORPHONE HCL 1 MG/ML IJ SOLN
0.2500 mg | INTRAMUSCULAR | Status: DC | PRN
  Administered 2014-06-22 (×2): 0.5 mg via INTRAVENOUS

## 2014-06-22 MED ORDER — ENOXAPARIN SODIUM 40 MG/0.4ML ~~LOC~~ SOLN
40.0000 mg | SUBCUTANEOUS | Status: DC
Start: 2014-06-23 — End: 2014-06-25
  Administered 2014-06-23 – 2014-06-24 (×2): 40 mg via SUBCUTANEOUS
  Filled 2014-06-22 (×2): qty 0.4

## 2014-06-22 MED ORDER — METHOCARBAMOL 1000 MG/10ML IJ SOLN
1000.0000 mg | Freq: Four times a day (QID) | INTRAVENOUS | Status: DC | PRN
  Filled 2014-06-22: qty 10

## 2014-06-22 MED ORDER — ROCURONIUM BROMIDE 100 MG/10ML IV SOLN
INTRAVENOUS | Status: DC | PRN
  Administered 2014-06-22: 10 mg via INTRAVENOUS
  Administered 2014-06-22: 40 mg via INTRAVENOUS

## 2014-06-22 MED ORDER — LORAZEPAM 1 MG PO TABS
1.0000 mg | ORAL_TABLET | Freq: Four times a day (QID) | ORAL | Status: DC | PRN
  Administered 2014-06-23 – 2014-06-25 (×4): 1 mg via ORAL
  Filled 2014-06-22 (×4): qty 1

## 2014-06-22 MED ORDER — ONDANSETRON HCL 4 MG/2ML IJ SOLN
INTRAMUSCULAR | Status: DC | PRN
  Administered 2014-06-22: 4 mg via INTRAVENOUS

## 2014-06-22 MED ORDER — LORAZEPAM 2 MG/ML IJ SOLN: 1.0000 mg | Freq: Four times a day (QID) | INTRAMUSCULAR | Status: DC | PRN

## 2014-06-22 MED ORDER — LORAZEPAM 1 MG PO TABS
0.0000 mg | ORAL_TABLET | Freq: Two times a day (BID) | ORAL | Status: DC
  Filled 2014-06-22: qty 2

## 2014-06-22 MED ORDER — DOCUSATE SODIUM 100 MG PO CAPS
100.0000 mg | ORAL_CAPSULE | Freq: Two times a day (BID) | ORAL | Status: DC
  Administered 2014-06-22 – 2014-06-25 (×6): 100 mg via ORAL
  Filled 2014-06-22 (×6): qty 1

## 2014-06-22 MED ORDER — ONDANSETRON HCL 4 MG/2ML IJ SOLN: 4.0000 mg | Freq: Four times a day (QID) | INTRAMUSCULAR | Status: DC | PRN

## 2014-06-22 MED ORDER — FOLIC ACID 1 MG PO TABS
1.0000 mg | ORAL_TABLET | Freq: Every day | ORAL | Status: DC
  Administered 2014-06-22 – 2014-06-25 (×4): 1 mg via ORAL
  Filled 2014-06-22 (×4): qty 1

## 2014-06-22 MED ORDER — METOCLOPRAMIDE HCL 10 MG PO TABS: 5.0000 mg | ORAL_TABLET | Freq: Three times a day (TID) | ORAL | Status: DC | PRN

## 2014-06-22 MED ORDER — CEFAZOLIN SODIUM 1-5 GM-% IV SOLN
1.0000 g | Freq: Four times a day (QID) | INTRAVENOUS | Status: AC
Start: 2014-06-22 — End: 2014-06-23
  Administered 2014-06-22 – 2014-06-23 (×3): 1 g via INTRAVENOUS
  Filled 2014-06-22 (×3): qty 50

## 2014-06-22 MED ORDER — VITAMIN B-1 100 MG PO TABS
100.0000 mg | ORAL_TABLET | Freq: Every day | ORAL | Status: DC
  Administered 2014-06-22 – 2014-06-25 (×4): 100 mg via ORAL
  Filled 2014-06-22 (×4): qty 1

## 2014-06-22 MED ORDER — SENNOSIDES-DOCUSATE SODIUM 8.6-50 MG PO TABS: 1.0000 | ORAL_TABLET | Freq: Every evening | ORAL | Status: DC | PRN

## 2014-06-22 SURGICAL SUPPLY — 87 items
BANDAGE ELASTIC 4 VELCRO ST LF (GAUZE/BANDAGES/DRESSINGS) ×2 IMPLANT
BANDAGE ELASTIC 6 VELCRO ST LF (GAUZE/BANDAGES/DRESSINGS) ×2 IMPLANT
BANDAGE ESMARK 6X9 LF (GAUZE/BANDAGES/DRESSINGS) ×1 IMPLANT
BLADE SURG 10 STRL SS (BLADE) ×2 IMPLANT
BLADE SURG 15 STRL LF DISP TIS (BLADE) ×1 IMPLANT
BLADE SURG 15 STRL SS (BLADE)
BLADE SURG ROTATE 9660 (MISCELLANEOUS) IMPLANT
BNDG CMPR 9X6 STRL LF SNTH (GAUZE/BANDAGES/DRESSINGS) ×1
BNDG COHESIVE 4X5 TAN STRL (GAUZE/BANDAGES/DRESSINGS) ×2 IMPLANT
BNDG ESMARK 6X9 LF (GAUZE/BANDAGES/DRESSINGS) ×2
BNDG GAUZE ELAST 4 BULKY (GAUZE/BANDAGES/DRESSINGS) ×2 IMPLANT
BONE INJECT NORIAN 5CC STRL (Bone Implant) ×1 IMPLANT
BRUSH SCRUB DISP (MISCELLANEOUS) ×4 IMPLANT
CANISTER SUCT 3000ML PPV (MISCELLANEOUS) ×2 IMPLANT
COVER MAYO STAND STRL (DRAPES) ×2 IMPLANT
COVER SURGICAL LIGHT HANDLE (MISCELLANEOUS) ×2 IMPLANT
DRAPE C-ARM 42X72 X-RAY (DRAPES) ×2 IMPLANT
DRAPE C-ARMOR (DRAPES) ×2 IMPLANT
DRAPE INCISE IOBAN 66X45 STRL (DRAPES) ×1 IMPLANT
DRAPE ORTHO SPLIT 77X108 STRL (DRAPES)
DRAPE SURG ORHT 6 SPLT 77X108 (DRAPES) IMPLANT
DRAPE U-SHAPE 47X51 STRL (DRAPES) ×2 IMPLANT
DRILL BIT 2.5X100 214235007 (MISCELLANEOUS) ×1 IMPLANT
DRILL BIT 2.7X100 214235006 DU (MISCELLANEOUS) ×1 IMPLANT
DRSG ADAPTIC 3X8 NADH LF (GAUZE/BANDAGES/DRESSINGS) ×2 IMPLANT
DRSG PAD ABDOMINAL 8X10 ST (GAUZE/BANDAGES/DRESSINGS) ×8 IMPLANT
ELECT REM PT RETURN 9FT ADLT (ELECTROSURGICAL) ×2
ELECTRODE REM PT RTRN 9FT ADLT (ELECTROSURGICAL) ×1 IMPLANT
EVACUATOR 1/8 PVC DRAIN (DRAIN) IMPLANT
EVACUATOR 3/16  PVC DRAIN (DRAIN)
EVACUATOR 3/16 PVC DRAIN (DRAIN) IMPLANT
GAUZE SPONGE 4X4 12PLY STRL (GAUZE/BANDAGES/DRESSINGS) ×2 IMPLANT
GLOVE BIO SURGEON STRL SZ 6.5 (GLOVE) ×1 IMPLANT
GLOVE BIO SURGEON STRL SZ7.5 (GLOVE) ×2 IMPLANT
GLOVE BIO SURGEON STRL SZ8 (GLOVE) ×2 IMPLANT
GLOVE BIOGEL PI IND STRL 7.0 (GLOVE) IMPLANT
GLOVE BIOGEL PI IND STRL 7.5 (GLOVE) ×1 IMPLANT
GLOVE BIOGEL PI IND STRL 8 (GLOVE) ×1 IMPLANT
GLOVE BIOGEL PI INDICATOR 7.0 (GLOVE) ×1
GLOVE BIOGEL PI INDICATOR 7.5 (GLOVE) ×1
GLOVE BIOGEL PI INDICATOR 8 (GLOVE) ×1
GOWN STRL REUS W/ TWL LRG LVL3 (GOWN DISPOSABLE) ×2 IMPLANT
GOWN STRL REUS W/ TWL XL LVL3 (GOWN DISPOSABLE) ×1 IMPLANT
GOWN STRL REUS W/TWL LRG LVL3 (GOWN DISPOSABLE) ×4
GOWN STRL REUS W/TWL XL LVL3 (GOWN DISPOSABLE) ×2
IMMOBILIZER KNEE 22 UNIV (SOFTGOODS) ×2 IMPLANT
KIT BASIN OR (CUSTOM PROCEDURE TRAY) ×2 IMPLANT
KIT ROOM TURNOVER OR (KITS) ×2 IMPLANT
NDL SUT 6 .5 CRC .975X.05 MAYO (NEEDLE) IMPLANT
NEEDLE 22X1 1/2 (OR ONLY) (NEEDLE) IMPLANT
NEEDLE DEL STER SRS 12GX7.5CM (NEEDLE) ×1 IMPLANT
NEEDLE MAYO TAPER (NEEDLE) ×2
NS IRRIG 1000ML POUR BTL (IV SOLUTION) ×2 IMPLANT
PACK ORTHO EXTREMITY (CUSTOM PROCEDURE TRAY) ×2 IMPLANT
PAD ARMBOARD 7.5X6 YLW CONV (MISCELLANEOUS) ×4 IMPLANT
PAD CAST 4YDX4 CTTN HI CHSV (CAST SUPPLIES) ×1 IMPLANT
PADDING CAST COTTON 4X4 STRL (CAST SUPPLIES) ×2
PADDING CAST COTTON 6X4 STRL (CAST SUPPLIES) ×2 IMPLANT
PLATE LOCK 7H STD LT PROX TIB (Plate) ×1 IMPLANT
SCREW CORT 3.5X40 815037040 (Screw) ×1 IMPLANT
SCREW CORT 3.5X44 815037044 (Screw) ×1 IMPLANT
SCREW CORTICAL 3.5MM 36MM (Screw) ×1 IMPLANT
SCREW CORTICAL 3.5MM 38MM (Screw) ×1 IMPLANT
SCREW LOCK CORT STAR 3.5X70 (Screw) ×3 IMPLANT
SCREW LOCK CORT STAR 3.5X75 (Screw) ×2 IMPLANT
SCREW LOCK CORT STAR 3.5X80 (Screw) ×2 IMPLANT
SCREW LP 3.5X90MM (Screw) ×1 IMPLANT
SPONGE LAP 18X18 X RAY DECT (DISPOSABLE) ×2 IMPLANT
STAPLER VISISTAT 35W (STAPLE) ×2 IMPLANT
STOCKINETTE IMPERVIOUS LG (DRAPES) ×2 IMPLANT
SUCTION FRAZIER TIP 10 FR DISP (SUCTIONS) ×2 IMPLANT
SUT ETHILON 3 0 PS 1 (SUTURE) ×3 IMPLANT
SUT PROLENE 0 CT 2 (SUTURE) ×3 IMPLANT
SUT VIC AB 0 CT1 27 (SUTURE) ×2
SUT VIC AB 0 CT1 27XBRD ANBCTR (SUTURE) ×1 IMPLANT
SUT VIC AB 1 CT1 27 (SUTURE) ×2
SUT VIC AB 1 CT1 27XBRD ANBCTR (SUTURE) ×1 IMPLANT
SUT VIC AB 2-0 CT1 27 (SUTURE) ×4
SUT VIC AB 2-0 CT1 TAPERPNT 27 (SUTURE) ×2 IMPLANT
SYR 20ML ECCENTRIC (SYRINGE) IMPLANT
TOWEL OR 17X24 6PK STRL BLUE (TOWEL DISPOSABLE) ×2 IMPLANT
TOWEL OR 17X26 10 PK STRL BLUE (TOWEL DISPOSABLE) ×4 IMPLANT
TRAY FOLEY CATH 16FRSI W/METER (SET/KITS/TRAYS/PACK) IMPLANT
TUBE CONNECTING 12X1/4 (SUCTIONS) ×2 IMPLANT
WATER STERILE IRR 1000ML POUR (IV SOLUTION) ×2 IMPLANT
WIRE K 1.6MM 144256 (MISCELLANEOUS) ×3 IMPLANT
YANKAUER SUCT BULB TIP NO VENT (SUCTIONS) ×2 IMPLANT

## 2014-06-22 NOTE — Brief Op Note (Signed)
06/19/2014 - 06/22/2014  5:02 PM  PATIENT:  Mellody DanceFred L Wuebker  [age] y.o. male  PRE-OPERATIVE DIAGNOSIS:  LEFT LATERAL TIBIAL PLATEAU AND EMINENCE FRACTURE   POST-OPERATIVE DIAGNOSIS:  LEFT LATERAL TIBIAL PLATEAU AND EMINENCE FRACTURE   PROCEDURE:  Procedure(s): 1. OPEN REDUCTION INTERNAL FIXATION (ORIF) LEFT LATERAL TIBIAL PLATEAU AND EMINENCE FRACTURE  2. ANTERIOR COMPARTMENT FASCIOTOMY  SURGEON:  Surgeon(s) and Role:    * Budd PalmerMichael H Rogena Deupree, MD - Primary  PHYSICIAN ASSISTANT: Montez MoritaKeith Paul, PA-C  ANESTHESIA:   general  I/O:  Total I/O In: 1500 [I.V.:1500] Out: 100 [Blood:100]  SPECIMEN:  No Specimen  TOURNIQUET:   Total Tourniquet Time Documented: Thigh (Left) - 21 minutes Total: Thigh (Left) - 21 minutes   DICTATION: .Other Dictation: Dictation Number [id number]

## 2014-06-22 NOTE — Transfer of Care (Signed)
Immediate Anesthesia Transfer of Care Note  Patient: Jesse DanceFred L Prowell  Procedure(s) Performed: Procedure(s): OPEN REDUCTION INTERNAL FIXATION (ORIF) LEFT TIBIAL PLATEAU (Left)  Patient Location: PACU  Anesthesia Type:General  Level of Consciousness: awake and alert   Airway & Oxygen Therapy: Patient Spontanous Breathing and Patient connected to nasal cannula oxygen  Post-op Assessment: Report given to RN and Post -op Vital signs reviewed and stable  Post vital signs: Reviewed and stable  Last Vitals:  Filed Vitals:   06/22/14 1532  BP:   Pulse:   Temp: 36.4 C  Resp:     Complications: No apparent anesthesia complications

## 2014-06-22 NOTE — Anesthesia Postprocedure Evaluation (Signed)
Anesthesia Post Note  Patient: Jesse Mcintosh  Procedure(s) Performed: Procedure(s) (LRB): OPEN REDUCTION INTERNAL FIXATION (ORIF) LEFT TIBIAL PLATEAU (Left)  Anesthesia type: General  Patient location: PACU  Post pain: Pain level controlled  Post assessment: Post-op Vital signs reviewed  Last Vitals: BP 163/91 mmHg  Pulse 110  Temp(Src) 37.2 C (Oral)  Resp 17  Ht 6\' 2"  (1.88 m)  Wt 173 lb 8 oz (78.699 kg)  BMI 22.27 kg/m2  SpO2 100%  Post vital signs: Reviewed  Level of consciousness: sedated  Complications: No apparent anesthesia complications

## 2014-06-22 NOTE — Anesthesia Preprocedure Evaluation (Signed)
Anesthesia Evaluation  Patient identified by MRN, date of birth, ID band Patient awake    Reviewed: Allergy & Precautions, NPO status , Patient's Chart, lab work & pertinent test results  Airway Mallampati: II  TM Distance: >3 FB Neck ROM: Full    Dental no notable dental hx.    Pulmonary Current Smoker,  breath sounds clear to auscultation  Pulmonary exam normal       Cardiovascular negative cardio ROS  Rhythm:Regular Rate:Normal     Neuro/Psych negative neurological ROS  negative psych ROS   GI/Hepatic negative GI ROS, Neg liver ROS,   Endo/Other  negative endocrine ROS  Renal/GU negative Renal ROS     Musculoskeletal negative musculoskeletal ROS (+)   Abdominal   Peds  Hematology negative hematology ROS (+)   Anesthesia Other Findings   Reproductive/Obstetrics                             Anesthesia Physical Anesthesia Plan  ASA: II  Anesthesia Plan: General   Post-op Pain Management:    Induction: Intravenous  Airway Management Planned: Oral ETT  Additional Equipment:   Intra-op Plan:   Post-operative Plan: Extubation in OR  Informed Consent: I have reviewed the patients History and Physical, chart, labs and discussed the procedure including the risks, benefits and alternatives for the proposed anesthesia with the patient or authorized representative who has indicated his/her understanding and acceptance.   Dental advisory given  Plan Discussed with: CRNA  Anesthesia Plan Comments:         Anesthesia Quick Evaluation

## 2014-06-22 NOTE — Progress Notes (Signed)
Orthopedic Tech Progress Note Patient Details:  Mellody DanceFred L Corbello 07-05-1975 [id number] Patient refused ohf. Patient ID: Mellody DanceFred L Caisse, male   DOB: 07-05-1975, 39 y.o.   MRN: [id number]   Jennye MoccasinHughes, Alyss Granato Craig 06/22/2014, 7:41 PM

## 2014-06-23 ENCOUNTER — Encounter (HOSPITAL_COMMUNITY): Payer: Self-pay | Admitting: Orthopedic Surgery

## 2014-06-23 LAB — PHOSPHORUS: PHOSPHORUS: 4.5 mg/dL (ref 2.3–4.6)

## 2014-06-23 LAB — CBC
HCT: 36.4 % — ABNORMAL LOW (ref 39.0–52.0)
Hemoglobin: 12.5 g/dL — ABNORMAL LOW (ref 13.0–17.0)
MCH: 31.5 pg (ref 26.0–34.0)
MCHC: 34.3 g/dL (ref 30.0–36.0)
MCV: 91.7 fL (ref 78.0–100.0)
PLATELETS: 234 10*3/uL (ref 150–400)
RBC: 3.97 MIL/uL — ABNORMAL LOW (ref 4.22–5.81)
RDW: 11.8 % (ref 11.5–15.5)
WBC: 10.7 10*3/uL — AB (ref 4.0–10.5)

## 2014-06-23 LAB — COMPREHENSIVE METABOLIC PANEL
ALT: 55 U/L — ABNORMAL HIGH (ref 0–53)
AST: 30 U/L (ref 0–37)
Albumin: 3.2 g/dL — ABNORMAL LOW (ref 3.5–5.2)
Alkaline Phosphatase: 58 U/L (ref 39–117)
Anion gap: 7 (ref 5–15)
BUN: 6 mg/dL (ref 6–23)
CHLORIDE: 101 mmol/L (ref 96–112)
CO2: 28 mmol/L (ref 19–32)
CREATININE: 1.12 mg/dL (ref 0.50–1.35)
Calcium: 8.6 mg/dL (ref 8.4–10.5)
GFR calc Af Amer: >90 mL/min (ref >90)
GFR, EST NON AFRICAN AMERICAN: 81 mL/min — AB (ref >90)
GLUCOSE: 131 mg/dL — AB (ref 70–99)
Potassium: 3.6 mmol/L (ref 3.5–5.1)
Sodium: 136 mmol/L (ref 135–145)
Total Bilirubin: 1.6 mg/dL — ABNORMAL HIGH (ref 0.3–1.2)
Total Protein: 6.2 g/dL (ref 6.0–8.3)

## 2014-06-23 LAB — IRON AND TIBC
IRON: 10 ug/dL — AB (ref 42–165)
SATURATION RATIOS: 4 % — AB (ref 20–55)
TIBC: 231 ug/dL (ref 215–435)
UIBC: 221 ug/dL (ref 125–400)

## 2014-06-23 LAB — SEX HORMONE BINDING GLOBULIN: Sex Hormone Binding: 14 nmol/L (ref 10–50)

## 2014-06-23 LAB — RETICULOCYTES
RBC.: 3.9 MIL/uL — ABNORMAL LOW (ref 4.22–5.81)
RETIC CT PCT: 0.9 % (ref 0.4–3.1)
Retic Count, Absolute: 35.1 10*3/uL (ref 19.0–186.0)

## 2014-06-23 LAB — TESTOSTERONE: Testosterone: 106 ng/dL — ABNORMAL LOW (ref 300–890)

## 2014-06-23 LAB — TESTOSTERONE, % FREE: Testosterone-% Free: 2.8 % — ABNORMAL HIGH (ref 1.6–2.9)

## 2014-06-23 LAB — FOLATE: Folate: 15.7 ng/mL

## 2014-06-23 LAB — VITAMIN B12: Vitamin B-12: 664 pg/mL (ref 211–911)

## 2014-06-23 LAB — FERRITIN: FERRITIN: 304 ng/mL (ref 22–322)

## 2014-06-23 LAB — MAGNESIUM: Magnesium: 1.8 mg/dL (ref 1.5–2.5)

## 2014-06-23 LAB — TESTOSTERONE, FREE: Testosterone, Free: 29.3 pg/mL — ABNORMAL LOW (ref 47.0–244.0)

## 2014-06-23 LAB — PREALBUMIN: PREALBUMIN: 21.7 mg/dL (ref 17.0–34.0)

## 2014-06-23 MED ORDER — METHOCARBAMOL 500 MG PO TABS
1000.0000 mg | ORAL_TABLET | Freq: Four times a day (QID) | ORAL | Status: DC
  Administered 2014-06-23 – 2014-06-25 (×9): 1000 mg via ORAL
  Filled 2014-06-23 (×9): qty 2

## 2014-06-23 MED ORDER — METHOCARBAMOL 1000 MG/10ML IJ SOLN
1000.0000 mg | Freq: Four times a day (QID) | INTRAMUSCULAR | Status: DC
  Filled 2014-06-23 (×12): qty 10

## 2014-06-23 NOTE — Op Note (Signed)
NAMBelinda Block:  Zimmermann, Shrihaan                ACCOUNT NO.:  [id number]  MEDICAL RECORD NO.:  [id number]  LOCATION:  [id number]                        FACILITY:  [facility]  PHYSICIAN:  Doralee AlbinoMichael H. Carola FrostHandy, M.D. DATE OF BIRTH:  [birth year]  DATE OF PROCEDURE:  06/22/2014 DATE OF DISCHARGE:                              OPERATIVE REPORT   PREOPERATIVE DIAGNOSES:  Left lateral tibial plateau fracture and tibial eminence fracture.  POSTOPERATIVE DIAGNOSES:  Left lateral tibial plateau fracture and tibial eminence fracture.  PROCEDURES: 1. Open reduction and internal fixation of left lateral tibial plateau     and eminence fracture. 2. Anterior compartment fasciotomy. 3. Stress view under fluoroscopy.  SURGEON:  Doralee AlbinoMichael H. Carola FrostHandy, M.D.  ASSISTANT:  Mearl LatinKeith W Paul, PA-C  ANESTHESIA:  General.  COMPLICATIONS:  None.  I/O:  1500 mL in.  ESTIMATED BLOOD LOSS:  100 mL.  TOURNIQUET:  21 minutes.  DISPOSITION:  To PACU.  CONDITION:  Stable.  BRIEF SUMMARY AND INDICATION FOR PROCEDURE:  Jesse Mcintosh is a 39 year old male, who sustained a severely displaced impacted lateral plateau fracture.  He was treated with bulky Jones dressing and now presents for definitive repair.  I discussed with him the risks and benefits of surgery including the possibility of infection, nerve injury, vessel injury, DVT, PE, heart attack, stroke, arthritis, loss of motion, symptomatic hardware, need for further surgery among others.  BRIEF SUMMARY OF PROCEDURE:  Jesse Mcintosh was taken to the OR for induction of general anesthesia, he did receive preoperative antibiotics.  His left lower extremity was prepped and draped in usual sterile fashion.  The tourniquet was not initially inflated.  After time- out, a standard anterolateral approach was made.  Dissection carried down to the anterior compartment.  Arthrotomy was made proximal to the joint line.  The coronary ligaments incised along its insertion into the retinaculum and  Prolene sutures passed into the edge of the meniscus for reflection.  The meniscus was intact.  The impacted segment of the plateau was large, the tibial eminence could be seen medially and was not displaced proximally such that it was floating here and there, but rather could be squeezed and compressed between the major fragment and the intact medial plateau.  As a consequence, the split in the tibia was opened and this enabled me to insert a Cobb which was used to tease up the major fragments with associated subchondral bone.  This was then secured provisionally at the level of the eminence against the medial plateau.  The opened cortical segment was closed and secured with tenaculum and then, a plate applied along the lateral side squeezing it into place and compressing the tibial eminence and then the plate checked for position on lateral and AP images and followed up with standard fixation and then locked fixation.  Final images showed appropriate reduction, hardware trajectory and length.  The area was injected of impaction with 5 mL of Norian calcium phosphate cement and the joint checked for any extravasation of which there was none.  A stress view of the knee did not show excessive instability that would require additional bracing.  The anterior compartment was then isolated distally and long mets used  to release the anterior compartment fascia along its course, a 10 cm distal to the edge of the incision.  Montez Morita, PA-C, assistant was absolutely necessary to hold the knee in the proper amount of varus to achieve reduction as well as for placement of both provisional and definitive fixation and assistance with closure.  PROGNOSIS:  Jesse Mcintosh is at high-risk for noncompliance given his polysubstance abuse.     Doralee Albino. Carola Frost, M.D.     [facility]/MEDQ  D:  06/22/2014  T:  06/23/2014  Job:  [id number]
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
## 2014-06-23 NOTE — Progress Notes (Signed)
Orthopaedic Trauma Service (OTS)  Subjective: 1 Day Post-Op Procedure(s) (LRB): OPEN REDUCTION INTERNAL FIXATION (ORIF) LEFT TIBIAL PLATEAU (Left) Patient reports pain as mild.    Objective: Current Vitals Blood pressure 142/79, pulse 128, temperature 98.4 F (36.9 C), temperature source Oral, resp. rate 18, height  (1.88 m), weight 173 lb 8 oz (78.699 kg), SpO2 99 %. Vital signs in last 24 hours: Temp:  [97.6 F (36.4 C)-99.1 F (37.3 C)] 98.4 F (36.9 C) (02/24 0443) Pulse Rate:  [99-131] 128 (02/24 0443) Resp:  [10-30] 18 (02/24 0443) BP: (122-163)/(77-135) 142/79 mmHg (02/24 0443) SpO2:  [96 %-100 %] 99 % (02/24 0443)  Intake/Output from previous day: 02/23 0701 - 02/24 0700 In: 2790 [P.O.:240; I.V.:2400; IV Piggyback:150] Out: 2100 [Urine:2000; Blood:100]  LABS  Recent Labs  06/21/14 0847 06/22/14 0633 06/23/14 0538  HGB 14.1 13.5 12.5*    Recent Labs  06/22/14 0633 06/23/14 0538  WBC 6.3 10.7*  RBC 4.26 3.97*  3.90*  HCT 39.0 36.4*  PLT 211 234    Recent Labs  06/21/14 0847 06/23/14 0538  NA 140 136  K 3.9 3.6  CL 109 101  CO2 27 28  BUN 7 6  CREATININE 0.96 1.12  GLUCOSE 115* 131*  CALCIUM 8.4 8.6    Recent Labs  06/22/14 0633  INR 0.99    Physical Exam  LLE Dressing clean and dry  Has removed immobilizer and resting with knee flexed  Sens DPN, SPN, TN intact  Motor EHL, ext, flex, evers 5/5  DP 2+, edema mild    Imaging Dg Tibia/fibula Left  06/22/2014   CLINICAL DATA:  ORIF left tibial plateau fracture.  EXAM: LEFT TIBIA AND FIBULA - 2 VIEW; DG C-ARM 61-120 MIN  FLUOROSCOPY TIME:  C-arm fluoroscopic images were obtained intraoperatively and submitted for post operative interpretation. Please see the performing provider's procedural report for the fluoroscopy time utilized.  COMPARISON:  06/19/2014  FINDINGS: Four intraoperative spot fluoroscopic images of the left knee/proximal tibia and fibula are provided. These  demonstrate interval lateral plate and screw fixation of the previously documented tibial plateau fracture, with the fracture fragments being reduced and appearing in anatomic alignment on these limited fluoroscopic images. The knee is located. Nondisplaced proximal fibular fracture is not well seen.  IMPRESSION: Intraoperative images during ORIF of left tibial plateau fracture.   Electronically Signed   By: Sebastian Ache   On: 06/22/2014 15:41   Dg Chest Port 1 View  06/21/2014   CLINICAL DATA:  Pre-op film for left leg surgery, no chest complaints  EXAM: PORTABLE CHEST - 1 VIEW  COMPARISON:  03/03/2012  FINDINGS: Heart, mediastinum hila are unremarkable. Lungs are clear. No pleural effusion or pneumothorax. Bony thorax is intact.  IMPRESSION: No active disease.   Electronically Signed   By: Amie Portland M.D.   On: 06/21/2014 16:09   Dg Knee Left Port  06/22/2014   CLINICAL DATA:  Left tibial plateau fracture, operative fixation.  EXAM: PORTABLE LEFT KNEE - 1-2 VIEW  COMPARISON:  06/19/2014  FINDINGS: The patient is status post ORIF of the intra-articular lateral left tibia plateau fracture. Anatomic alignment. Preserved joint spaces. No significant arthropathy. No hardware abnormality. Joint effusion on the lateral view evident.  IMPRESSION: Status post ORIF of the left lateral tibial plateau intra-articular fracture without complicating feature.   Electronically Signed   By: Judie Petit.  Shick M.D.   On: 06/22/2014 20:18   Dg Knee Left Port  06/22/2014   CLINICAL DATA:  Postop ORIF of left tibial plateau fracture.  EXAM: PORTABLE LEFT KNEE - 1-2 VIEW  COMPARISON:  06/19/2014  FINDINGS: Interval lateral plate and screw fixation of the previously described tibial plateau fracture has been performed. The distal aspect of the plate is incompletely imaged. Comminuted lateral tibial plateau fracture has been reduced with alignment now appearing near anatomic. Cement is noted in the tibia. There is no dislocation. A  small knee joint effusion is present. Postoperative soft tissue emphysema is noted lateral to the knee. Nondisplaced proximal fibular fracture is not well seen.  IMPRESSION: Interval ORIF of left tibial plateau fracture as above.   Electronically Signed   By: Sebastian AcheAllen  Grady   On: 06/22/2014 18:39   Dg Ankle Left Port  06/21/2014   CLINICAL DATA:  Left ankle pain on heel area.  Preop films.  EXAM: PORTABLE LEFT ANKLE - 2 VIEW  COMPARISON:  None.  FINDINGS: No acute bony abnormality. Specifically, no fracture, subluxation, or dislocation. Soft tissues are intact.  IMPRESSION: No acute bony abnormality.   Electronically Signed   By: Charlett NoseKevin  Dover M.D.   On: 06/21/2014 16:05   Dg C-arm 1-60 Min  06/22/2014   CLINICAL DATA:  ORIF left tibial plateau fracture.  EXAM: LEFT TIBIA AND FIBULA - 2 VIEW; DG C-ARM 61-120 MIN  FLUOROSCOPY TIME:  C-arm fluoroscopic images were obtained intraoperatively and submitted for post operative interpretation. Please see the performing provider's procedural report for the fluoroscopy time utilized.  COMPARISON:  06/19/2014  FINDINGS: Four intraoperative spot fluoroscopic images of the left knee/proximal tibia and fibula are provided. These demonstrate interval lateral plate and screw fixation of the previously documented tibial plateau fracture, with the fracture fragments being reduced and appearing in anatomic alignment on these limited fluoroscopic images. The knee is located. Nondisplaced proximal fibular fracture is not well seen.  IMPRESSION: Intraoperative images during ORIF of left tibial plateau fracture.   Electronically Signed   By: Sebastian AcheAllen  Grady   On: 06/22/2014 15:41    Assessment/Plan: 1 Day Post-Op Procedure(s) (LRB): OPEN REDUCTION INTERNAL FIXATION (ORIF) LEFT TIBIAL PLATEAU (Left)  1. NWB on LLE with PT/ OT 2. FULL EXTENSION OF KNEE IS CRITICAL! 3. Lovenox then ECASA for d/c 4. D/c planning  Jesse GalasMichael Reneisha Stilley, MD [facility]  [facility] [phone] (p)   06/23/2014, 9:08 AM

## 2014-06-24 ENCOUNTER — Encounter (HOSPITAL_COMMUNITY): Payer: Self-pay | Admitting: Orthopedic Surgery

## 2014-06-24 DIAGNOSIS — E611 Iron deficiency: Secondary | ICD-10-CM

## 2014-06-24 DIAGNOSIS — E349 Endocrine disorder, unspecified: Secondary | ICD-10-CM

## 2014-06-24 DIAGNOSIS — E559 Vitamin D deficiency, unspecified: Secondary | ICD-10-CM

## 2014-06-24 DIAGNOSIS — F17200 Nicotine dependence, unspecified, uncomplicated: Secondary | ICD-10-CM

## 2014-06-24 DIAGNOSIS — F1910 Other psychoactive substance abuse, uncomplicated: Secondary | ICD-10-CM | POA: Diagnosis present

## 2014-06-24 HISTORY — DX: Vitamin D deficiency, unspecified: E55.9

## 2014-06-24 HISTORY — DX: Iron deficiency: E61.1

## 2014-06-24 HISTORY — DX: Nicotine dependence, unspecified, uncomplicated: F17.200

## 2014-06-24 HISTORY — DX: Endocrine disorder, unspecified: E34.9

## 2014-06-24 LAB — PTH, INTACT AND CALCIUM
CALCIUM TOTAL (PTH): 8.6 mg/dL — AB (ref 8.7–10.2)
PTH: 24 pg/mL (ref 15–65)

## 2014-06-24 LAB — TRANSFERRIN: Transferrin: 187 mg/dL — ABNORMAL LOW (ref 200–370)

## 2014-06-24 MED ORDER — CHOLECALCIFEROL 25 MCG (1000 UT) PO TABS: 1000.0000 [IU] | ORAL_TABLET | Freq: Two times a day (BID) | ORAL | Status: DC

## 2014-06-24 MED ORDER — ASPIRIN EC 325 MG PO TBEC: 325.0000 mg | DELAYED_RELEASE_TABLET | Freq: Two times a day (BID) | ORAL | Status: DC

## 2014-06-24 MED ORDER — VITAMIN D (ERGOCALCIFEROL) 1.25 MG (50000 UNIT) PO CAPS
50000.0000 [IU] | ORAL_CAPSULE | ORAL | Status: DC
  Administered 2014-06-24: 50000 [IU] via ORAL
  Filled 2014-06-24: qty 1

## 2014-06-24 MED ORDER — OXYCODONE-ACETAMINOPHEN 5-325 MG PO TABS: 1.0000 | ORAL_TABLET | Freq: Four times a day (QID) | ORAL | Status: DC | PRN

## 2014-06-24 MED ORDER — FERROUS SULFATE 325 (65 FE) MG PO TABS: 325.0000 mg | ORAL_TABLET | Freq: Three times a day (TID) | ORAL | Status: DC

## 2014-06-24 MED ORDER — VITAMIN D (ERGOCALCIFEROL) 1.25 MG (50000 UNIT) PO CAPS: 50000.0000 [IU] | ORAL_CAPSULE | ORAL | Status: DC

## 2014-06-24 MED ORDER — VITAMIN D 1000 UNITS PO TABS
1000.0000 [IU] | ORAL_TABLET | Freq: Two times a day (BID) | ORAL | Status: DC
  Administered 2014-06-24 – 2014-06-25 (×3): 1000 [IU] via ORAL
  Filled 2014-06-24 (×3): qty 1

## 2014-06-24 MED ORDER — MUPIROCIN 2 % EX OINT: 1.0000 "application " | TOPICAL_OINTMENT | Freq: Two times a day (BID) | CUTANEOUS | Status: DC

## 2014-06-24 MED ORDER — ASCORBIC ACID 1000 MG PO TABS: 1000.0000 mg | ORAL_TABLET | Freq: Every day | ORAL | Status: DC

## 2014-06-24 MED ORDER — DOCUSATE SODIUM 100 MG PO CAPS: 100.0000 mg | ORAL_CAPSULE | Freq: Two times a day (BID) | ORAL | Status: DC

## 2014-06-24 MED ORDER — OXYCODONE HCL 5 MG PO TABS: 5.0000 mg | ORAL_TABLET | ORAL | Status: DC | PRN

## 2014-06-24 MED ORDER — FERROUS SULFATE 325 (65 FE) MG PO TABS
325.0000 mg | ORAL_TABLET | Freq: Three times a day (TID) | ORAL | Status: DC
  Administered 2014-06-24 – 2014-06-25 (×3): 325 mg via ORAL
  Filled 2014-06-24 (×3): qty 1

## 2014-06-24 MED ORDER — METHOCARBAMOL 500 MG PO TABS: 500.0000 mg | ORAL_TABLET | Freq: Four times a day (QID) | ORAL | Status: DC | PRN

## 2014-06-24 MED ORDER — ADULT MULTIVITAMIN W/MINERALS CH: 1.0000 | ORAL_TABLET | Freq: Every day | ORAL | Status: DC
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
## 2014-06-24 NOTE — Plan of Care (Signed)
Problem: Discharge Progression Outcomes Goal: Dressing/splint clean, dry, intact, no S/S of infection Outcome: Completed/Met Date Met:  06/24/14 dressimg

## 2014-06-24 NOTE — Discharge Instructions (Signed)
[facility] Discharge Instructions   General Discharge Instructions  WEIGHT BEARING STATUS: Nonweightbearing Left Leg  RANGE OF MOTION/ACTIVITY: Range of motion Left knee as tolerated, active and passive. Hinged brace on at all times.   Wound care: dressing changes Left leg every other day as needed starting on 06/26/2014. Once wound dry can shower, wash with soap and water only and leave open to air   Diet: as you were eating previously.  Can use over the counter stool softeners and bowel preparations, such as Miralax, to help with bowel movements.  Narcotics can be constipating.  Be sure to drink plenty of fluids  STOP SMOKING OR USING NICOTINE PRODUCTS!!!!  As discussed nicotine severely impairs your body's ability to heal surgical and traumatic wounds but also impairs bone healing.  Wounds and bone heal by forming microscopic blood vessels (angiogenesis) and nicotine is a vasoconstrictor (essentially, shrinks blood vessels).  Therefore, if vasoconstriction occurs to these microscopic blood vessels they essentially disappear and are unable to deliver necessary nutrients to the healing tissue.  This is one modifiable factor that you can do to dramatically increase your chances of healing your injury.    (This means no smoking, no nicotine gum, patches, etc)  DO NOT USE NONSTEROIDAL ANTI-INFLAMMATORY DRUGS (NSAID'S)  Using products such as Advil (ibuprofen), Aleve (naproxen), Motrin (ibuprofen) for additional pain control during fracture healing can delay and/or prevent the healing response.  If you would like to take over the counter (OTC) medication, Tylenol (acetaminophen) is ok.  However, some narcotic medications that are given for pain control contain acetaminophen as well. Therefore, you should not exceed more than 4000 mg of tylenol in a day if you do not have liver disease.  Also note that there are may OTC medicines, such as cold medicines and allergy medicines that my  contain tylenol as well.  If you have any questions about medications and/or interactions please ask your doctor/PA or your pharmacist.   PAIN MEDICATION USE AND EXPECTATIONS  You have likely been given narcotic medications to help control your pain.  After a traumatic event that results in an fracture (broken bone) with or without surgery, it is ok to use narcotic pain medications to help control one's pain.  We understand that everyone responds to pain differently and each individual patient will be evaluated on a regular basis for the continued need for narcotic medications. Ideally, narcotic medication use should last no more than 6-8 weeks (coinciding with fracture healing).   As a patient it is your responsibility as well to monitor narcotic medication use and report the amount and frequency you use these medications when you come to your office visit.   We would also advise that if you are using narcotic medications, you should take a dose prior to therapy to maximize you participation.  IF YOU ARE ON NARCOTIC MEDICATIONS IT IS NOT PERMISSIBLE TO OPERATE A MOTOR VEHICLE (MOTORCYCLE/CAR/TRUCK/MOPED) OR HEAVY MACHINERY DO NOT MIX NARCOTICS WITH OTHER CNS (CENTRAL NERVOUS SYSTEM) DEPRESSANTS SUCH AS ALCOHOL       ICE AND ELEVATE INJURED/OPERATIVE EXTREMITY  Using ice and elevating the injured extremity above your heart can help with swelling and pain control.  Icing in a pulsatile fashion, such as 20 minutes on and 20 minutes off, can be followed.    Do not place ice directly on skin. Make sure there is a barrier between to skin and the ice pack.    Using frozen items such as frozen peas works  well as the conform nicely to the are that needs to be iced.  USE AN ACE WRAP OR TED HOSE FOR SWELLING CONTROL  In addition to icing and elevation, Ace wraps or TED hose are used to help limit and resolve swelling.  It is recommended to use Ace wraps or TED hose until you are informed to stop.    When  using Ace Wraps start the wrapping distally (farthest away from the body) and wrap proximally (closer to the body)   Example: If you had surgery on your leg or thing and you do not have a splint on, start the ace wrap at the toes and work your way up to the thigh        If you had surgery on your upper extremity and do not have a splint on, start the ace wrap at your fingers and work your way up to the upper arm  IF YOU ARE IN A SPLINT OR CAST DO NOT REMOVE IT FOR ANY REASON   If your splint gets wet for any reason please contact the office immediately. You may shower in your splint or cast as long as you keep it dry.  This can be done by wrapping in a cast cover or garbage back (or similar)  Do Not stick any thing down your splint or cast such as pencils, money, or hangers to try and scratch yourself with.  If you feel itchy take benadryl as prescribed on the bottle for itching  IF YOU ARE IN A CAM BOOT (BLACK BOOT)  You may remove boot periodically. Perform daily dressing changes as noted below.  Wash the liner of the boot regularly and wear a sock when wearing the boot. It is recommended that you sleep in the boot until told otherwise  CALL THE OFFICE WITH ANY QUESTIONS OR CONCERTS: [phone]     Discharge Pin Site Instructions  Dress pins daily with Kerlix roll starting on POD 2. Wrap the Kerlix so that it tamps the skin down around the pin-skin interface to prevent/limit motion of the skin relative to the pin.  (Pin-skin motion is the primary cause of pain and infection related to external fixator pin sites).  Remove any crust or coagulum that may obstruct drainage with a saline moistened gauze or soap and water.  After POD 3, if there is no discernable drainage on the pin site dressing, the interval for change can by increased to every other day.  You may shower with the fixator, cleaning all pin sites gently with soap and water.  If you have a surgical wound this needs to be  completely dry and without drainage before showering.  The extremity can be lifted by the fixator to facilitate wound care and transfers.  Notify the office/Doctor if you experience increasing drainage, redness, or pain from a pin site, or if you notice purulent (thick, snot-like) drainage.  Discharge Wound Care Instructions  Do NOT apply any ointments, solutions or lotions to pin sites or surgical wounds.  These prevent needed drainage and even though solutions like hydrogen peroxide kill bacteria, they also damage cells lining the pin sites that help fight infection.  Applying lotions or ointments can keep the wounds moist and can cause them to breakdown and open up as well. This can increase the risk for infection. When in doubt call the office.  Surgical incisions should be dressed daily.  If any drainage is noted, use one layer of adaptic, then gauze, Kerlix, and an  ace wrap.  Once the incision is completely dry and without drainage, it may be left open to air out.  Showering may begin 36-48 hours later.  Cleaning gently with soap and water.  Traumatic wounds should be dressed daily as well.    One layer of adaptic, gauze, Kerlix, then ace wrap.  The adaptic can be discontinued once the draining has ceased    If you have a wet to dry dressing: wet the gauze with saline the squeeze as much saline out so the gauze is moist (not soaking wet), place moistened gauze over wound, then place a dry gauze over the moist one, followed by Kerlix wrap, then ace wrap.
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
## 2014-06-24 NOTE — Progress Notes (Signed)
Orthopedic Tech Progress Note Patient Details:  Jesse Mcintosh 08-05-1975 [id number]  Patient ID: Jesse Mcintosh, male   DOB: 08-05-1975, 39 y.o.   MRN: [id number] Called in advanced brace order; spoke with Francella SolianNadia  Ginnifer Creelman 06/24/2014, 9:32 AM
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
## 2014-06-25 LAB — CBC
HEMATOCRIT: 34.5 % — AB (ref 39.0–52.0)
Hemoglobin: 11.7 g/dL — ABNORMAL LOW (ref 13.0–17.0)
MCH: 31.2 pg (ref 26.0–34.0)
MCHC: 33.9 g/dL (ref 30.0–36.0)
MCV: 92 fL (ref 78.0–100.0)
Platelets: 294 10*3/uL (ref 150–400)
RBC: 3.75 MIL/uL — ABNORMAL LOW (ref 4.22–5.81)
RDW: 12 % (ref 11.5–15.5)
WBC: 11.2 10*3/uL — ABNORMAL HIGH (ref 4.0–10.5)

## 2014-06-25 LAB — HEPATIC FUNCTION PANEL
ALT: 42 U/L (ref 0–53)
AST: 34 U/L (ref 0–37)
Albumin: 2.8 g/dL — ABNORMAL LOW (ref 3.5–5.2)
Alkaline Phosphatase: 112 U/L (ref 39–117)
Bilirubin, Direct: 0.4 mg/dL (ref 0.0–0.5)
Indirect Bilirubin: 0.7 mg/dL (ref 0.3–0.9)
Total Bilirubin: 1.1 mg/dL (ref 0.3–1.2)
Total Protein: 6.9 g/dL (ref 6.0–8.3)

## 2014-06-25 LAB — GAMMA GT: GGT: 101 U/L — AB (ref 7–51)
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
## 2014-06-25 NOTE — Progress Notes (Signed)
Security notified, along with [place] [employer] for patient's discharge.  Discharge instructions given along with all prescriptions.

## 2014-06-26 LAB — VITAMIN D 1,25 DIHYDROXY
VITAMIN D3 1, 25 (OH): 21 pg/mL
Vitamin D 1, 25 (OH)2 Total: 21 pg/mL

## 2014-06-26 LAB — VITAMIN B6: VITAMIN B6: 10.8 ng/mL (ref 2.1–21.7)

## 2014-06-28 LAB — FOLATE RBC
FOLATE, RBC: 1150 ng/mL (ref >498)
Folate, Hemolysate: 419.7 ng/mL
Hematocrit: 36.5 % — ABNORMAL LOW (ref 37.5–51.0)

## 2014-06-28 LAB — HEMOGLOBIN A1C
Hgb A1c MFr Bld: 5.3 % (ref 4.8–5.6)
MEAN PLASMA GLUCOSE: 105 mg/dL

## 2014-06-30 LAB — TESTOSTERONE: Testosterone: 55 ng/dL — ABNORMAL LOW (ref 300–890)

## 2014-06-30 LAB — TESTOSTERONE, FREE: Testosterone, Free: 14.6 pg/mL — ABNORMAL LOW (ref 47.0–244.0)

## 2014-06-30 LAB — TESTOSTERONE, % FREE: Testosterone-% Free: 2.6 % — ABNORMAL HIGH (ref 1.6–2.9)

## 2014-06-30 LAB — SEX HORMONE BINDING GLOBULIN: Sex Hormone Binding: 15 nmol/L (ref 10–50)

## 2016-12-05 DIAGNOSIS — L720 Epidermal cyst: Secondary | ICD-10-CM | POA: Diagnosis not present

## 2017-02-18 ENCOUNTER — Ambulatory Visit (INDEPENDENT_AMBULATORY_CARE_PROVIDER_SITE_OTHER): Payer: BLUE CROSS/BLUE SHIELD | Admitting: Physician Assistant

## 2017-02-18 ENCOUNTER — Encounter: Payer: Self-pay | Admitting: Physician Assistant

## 2017-02-18 VITALS — BP 128/90 | HR 91 | Temp 97.8°F | Resp 16 | Ht 74.21 in | Wt 178.2 lb

## 2017-02-18 DIAGNOSIS — D751 Secondary polycythemia: Secondary | ICD-10-CM

## 2017-02-18 DIAGNOSIS — Z0000 Encounter for general adult medical examination without abnormal findings: Secondary | ICD-10-CM | POA: Diagnosis not present

## 2017-02-18 DIAGNOSIS — Z13228 Encounter for screening for other metabolic disorders: Secondary | ICD-10-CM

## 2017-02-18 DIAGNOSIS — Z130 Encounter for screening for diseases of the blood and blood-forming organs and certain disorders involving the immune mechanism: Secondary | ICD-10-CM | POA: Diagnosis not present

## 2017-02-18 DIAGNOSIS — Z1329 Encounter for screening for other suspected endocrine disorder: Secondary | ICD-10-CM

## 2017-02-18 DIAGNOSIS — Z1321 Encounter for screening for nutritional disorder: Secondary | ICD-10-CM | POA: Diagnosis not present
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
## 2017-02-18 NOTE — Patient Instructions (Signed)
     IF you received an x-ray today, you will receive an invoice from [facility]. Please contact [facility] at [phone] with questions or concerns regarding your invoice.   IF you received labwork today, you will receive an invoice from [facility]. Please contact [facility] at [phone] with questions or concerns regarding your invoice.   Our billing staff will not be able to assist you with questions regarding bills from these companies.  You will be contacted with the lab results as soon as they are available. The fastest way to get your results is to activate your My Chart account. Instructions are located on the last page of this paperwork. If you have not heard from us regarding the results in 2 weeks, please contact this office.     

## 2017-02-19 LAB — IRON,TIBC AND FERRITIN PANEL
FERRITIN: 282 ng/mL (ref 30–400)
Iron Saturation: 46 % (ref 15–55)
Iron: 147 ug/dL (ref 38–169)
Total Iron Binding Capacity: 323 ug/dL (ref 250–450)
UIBC: 176 ug/dL (ref 111–343)

## 2017-02-19 LAB — CBC WITH DIFFERENTIAL/PLATELET
BASOS ABS: 0 10*3/uL (ref 0.0–0.2)
Basos: 0 %
EOS (ABSOLUTE): 0.4 10*3/uL (ref 0.0–0.4)
Eos: 5 %
HEMATOCRIT: 51.5 % — AB (ref 37.5–51.0)
HEMOGLOBIN: 18.2 g/dL — AB (ref 13.0–17.7)
Immature Grans (Abs): 0 10*3/uL (ref 0.0–0.1)
Immature Granulocytes: 0 %
LYMPHS: 22 %
Lymphocytes Absolute: 2.1 10*3/uL (ref 0.7–3.1)
MCH: 33 pg (ref 26.6–33.0)
MCHC: 35.3 g/dL (ref 31.5–35.7)
MCV: 93 fL (ref 79–97)
MONOS ABS: 0.6 10*3/uL (ref 0.1–0.9)
Monocytes: 7 %
NEUTROS ABS: 6.2 10*3/uL (ref 1.4–7.0)
Neutrophils: 66 %
PLATELETS: 446 10*3/uL — AB (ref 150–379)
RBC: 5.52 x10E6/uL (ref 4.14–5.80)
RDW: 12.8 % (ref 12.3–15.4)
WBC: 9.5 10*3/uL (ref 3.4–10.8)

## 2017-02-19 LAB — LIPID PANEL
CHOLESTEROL TOTAL: 218 mg/dL — AB (ref 100–199)
Chol/HDL Ratio: 4.4 ratio (ref 0.0–5.0)
HDL: 49 mg/dL (ref >39)
LDL Calculated: 136 mg/dL — ABNORMAL HIGH (ref 0–99)
Triglycerides: 164 mg/dL — ABNORMAL HIGH (ref 0–149)
VLDL CHOLESTEROL CAL: 33 mg/dL (ref 5–40)

## 2017-02-19 LAB — CMP AND LIVER
ALBUMIN: 4.6 g/dL (ref 3.5–5.5)
ALT: 48 IU/L — ABNORMAL HIGH (ref 0–44)
AST: 20 IU/L (ref 0–40)
Alkaline Phosphatase: 107 IU/L (ref 39–117)
BUN: 9 mg/dL (ref 6–24)
Bilirubin Total: 0.6 mg/dL (ref 0.0–1.2)
Bilirubin, Direct: 0.21 mg/dL (ref 0.00–0.40)
CALCIUM: 9.5 mg/dL (ref 8.7–10.2)
CO2: 23 mmol/L (ref 20–29)
CREATININE: 1.15 mg/dL (ref 0.76–1.27)
Chloride: 94 mmol/L — ABNORMAL LOW (ref 96–106)
GFR, EST AFRICAN AMERICAN: 90 mL/min/{1.73_m2} (ref >59)
GFR, EST NON AFRICAN AMERICAN: 78 mL/min/{1.73_m2} (ref >59)
Glucose: 100 mg/dL — ABNORMAL HIGH (ref 65–99)
Potassium: 4.8 mmol/L (ref 3.5–5.2)
Sodium: 137 mmol/L (ref 134–144)
Total Protein: 8.1 g/dL (ref 6.0–8.5)

## 2017-02-19 LAB — HEMOGLOBIN A1C
Est. average glucose Bld gHb Est-mCnc: 111 mg/dL
Hgb A1c MFr Bld: 5.5 % (ref 4.8–5.6)

## 2017-02-19 LAB — HIV ANTIBODY (ROUTINE TESTING W REFLEX): HIV Screen 4th Generation wRfx: NONREACTIVE

## 2017-02-19 LAB — PSA: Prostate Specific Ag, Serum: 0.6 ng/mL (ref 0.0–4.0)

## 2017-02-20 DIAGNOSIS — D751 Secondary polycythemia: Secondary | ICD-10-CM | POA: Diagnosis not present

## 2017-02-20 NOTE — Addendum Note (Signed)
Addended by: Baldwin CrownJOHNSON, SHAQUETTA D on: 02/20/2017 05:04 PM   Modules accepted: Orders

## 2017-02-20 NOTE — Progress Notes (Signed)
Please add on iron, TIB, ferritin. Peripheral smear if we can get it. Deliah BostonMichael Dean Wonder, MS, PA-C 4:22 PM, 02/20/2017

## 2017-02-25 ENCOUNTER — Other Ambulatory Visit: Payer: Self-pay | Admitting: Physician Assistant

## 2017-02-25 DIAGNOSIS — D7589 Other specified diseases of blood and blood-forming organs: Secondary | ICD-10-CM

## 2017-02-25 LAB — PATHOLOGIST SMEAR REVIEW
Basophils Absolute: 0 10*3/uL (ref 0.0–0.2)
Basos: 0 %
EOS (ABSOLUTE): 0.4 10*3/uL (ref 0.0–0.4)
EOS: 5 %
HEMATOCRIT: 56.5 % — AB (ref 37.5–51.0)
HEMOGLOBIN: 18.3 g/dL — AB (ref 13.0–17.7)
IMMATURE GRANULOCYTES: 0 %
Immature Grans (Abs): 0 10*3/uL (ref 0.0–0.1)
LYMPHS ABS: 2.3 10*3/uL (ref 0.7–3.1)
Lymphs: 24 %
MCH: 33 pg (ref 26.6–33.0)
MCHC: 32.4 g/dL (ref 31.5–35.7)
MCV: 102 fL — ABNORMAL HIGH (ref 79–97)
Monocytes Absolute: 0.6 10*3/uL (ref 0.1–0.9)
Monocytes: 6 %
NEUTROS PCT: 65 %
Neutrophils Absolute: 6.1 10*3/uL (ref 1.4–7.0)
PLATELETS: 482 10*3/uL — AB (ref 150–379)
Path Rev WBC: NORMAL
RBC: 5.54 x10E6/uL (ref 4.14–5.80)
RDW: 13.1 % (ref 12.3–15.4)
WBC: 9.4 10*3/uL (ref 3.4–10.8)

## 2017-03-12 ENCOUNTER — Telehealth: Payer: Self-pay | Admitting: *Deleted

## 2017-03-12 NOTE — Telephone Encounter (Signed)
Copied from CRM #[id number]. Topic: General - Other >> Mar 08, 2017  5:04 PM Terisa Starraylor, Brittany L wrote: Reason for CRM:  Patient said he missed a call. He said he wants to know if it was regarding his labs from a few weeks ago. He said it can be mailed if possible.

## 2017-03-12 NOTE — Telephone Encounter (Signed)
Informed patient of results

## 2018-04-30 ENCOUNTER — Encounter (HOSPITAL_COMMUNITY): Payer: Self-pay | Admitting: Emergency Medicine

## 2018-04-30 ENCOUNTER — Emergency Department (HOSPITAL_COMMUNITY): Payer: BLUE CROSS/BLUE SHIELD

## 2018-04-30 ENCOUNTER — Emergency Department (HOSPITAL_COMMUNITY)
Admission: EM | Admit: 2018-04-30 | Discharge: 2018-04-30 | Disposition: A | Discharge status: Home / Self Care | Payer: BLUE CROSS/BLUE SHIELD | Attending: Emergency Medicine | Admitting: Emergency Medicine

## 2018-04-30 ENCOUNTER — Other Ambulatory Visit: Payer: Self-pay

## 2018-04-30 DIAGNOSIS — L03115 Cellulitis of right lower limb: Secondary | ICD-10-CM | POA: Diagnosis not present

## 2018-04-30 DIAGNOSIS — M79604 Pain in right leg: Secondary | ICD-10-CM | POA: Diagnosis not present

## 2018-04-30 DIAGNOSIS — Z87891 Personal history of nicotine dependence: Secondary | ICD-10-CM | POA: Insufficient documentation

## 2018-04-30 DIAGNOSIS — Z7982 Long term (current) use of aspirin: Secondary | ICD-10-CM | POA: Insufficient documentation

## 2018-04-30 DIAGNOSIS — M79661 Pain in right lower leg: Secondary | ICD-10-CM | POA: Diagnosis not present

## 2018-04-30 LAB — CBC WITH DIFFERENTIAL/PLATELET
Abs Immature Granulocytes: 0.05 10*3/uL (ref 0.00–0.07)
Basophils Absolute: 0 10*3/uL (ref 0.0–0.1)
Basophils Relative: 0 %
EOS ABS: 0.3 10*3/uL (ref 0.0–0.5)
Eosinophils Relative: 3 %
HCT: 41.6 % (ref 39.0–52.0)
Hemoglobin: 14.2 g/dL (ref 13.0–17.0)
IMMATURE GRANULOCYTES: 1 %
Lymphocytes Relative: 13 %
Lymphs Abs: 1.4 10*3/uL (ref 0.7–4.0)
MCH: 32.8 pg (ref 26.0–34.0)
MCHC: 34.1 g/dL (ref 30.0–36.0)
MCV: 96.1 fL (ref 80.0–100.0)
Monocytes Absolute: 1.1 10*3/uL — ABNORMAL HIGH (ref 0.1–1.0)
Monocytes Relative: 10 %
NEUTROS PCT: 73 %
Neutro Abs: 8.1 10*3/uL — ABNORMAL HIGH (ref 1.7–7.7)
PLATELETS: 263 10*3/uL (ref 150–400)
RBC: 4.33 MIL/uL (ref 4.22–5.81)
RDW: 11.5 % (ref 11.5–15.5)
WBC: 11 10*3/uL — ABNORMAL HIGH (ref 4.0–10.5)
nRBC: 0 % (ref 0.0–0.2)

## 2018-04-30 LAB — COMPREHENSIVE METABOLIC PANEL
ALBUMIN: 4.4 g/dL (ref 3.5–5.0)
ALT: 194 U/L — ABNORMAL HIGH (ref 0–44)
ANION GAP: 12 (ref 5–15)
AST: 188 U/L — ABNORMAL HIGH (ref 15–41)
Alkaline Phosphatase: 90 U/L (ref 38–126)
BUN: 7 mg/dL (ref 6–20)
CO2: 22 mmol/L (ref 22–32)
Calcium: 8.9 mg/dL (ref 8.9–10.3)
Chloride: 104 mmol/L (ref 98–111)
Creatinine, Ser: 1.08 mg/dL (ref 0.61–1.24)
GFR calc Af Amer: >60 mL/min (ref >60)
GFR calc non Af Amer: >60 mL/min (ref >60)
GLUCOSE: 111 mg/dL — AB (ref 70–99)
Potassium: 3.9 mmol/L (ref 3.5–5.1)
Sodium: 138 mmol/L (ref 135–145)
Total Bilirubin: 2 mg/dL — ABNORMAL HIGH (ref 0.3–1.2)
Total Protein: 8.2 g/dL — ABNORMAL HIGH (ref 6.5–8.1)

## 2018-04-30 LAB — I-STAT CG4 LACTIC ACID, ED
Lactic Acid, Venous: 1.53 mmol/L (ref 0.5–1.9)
Lactic Acid, Venous: 1.15 mmol/L (ref 0.5–1.9)

## 2018-04-30 MED ORDER — NAPROXEN 500 MG PO TABS: 500.0000 mg | ORAL_TABLET | Freq: Two times a day (BID) | ORAL | 0 refills | Status: DC

## 2018-04-30 MED ORDER — SODIUM CHLORIDE 0.9 % IV SOLN
1.0000 g | Freq: Once | INTRAVENOUS | Status: AC
  Administered 2018-04-30: 1 g via INTRAVENOUS
  Filled 2018-04-30: qty 10

## 2018-04-30 MED ORDER — CEPHALEXIN 250 MG PO CAPS: 250.0000 mg | ORAL_CAPSULE | Freq: Four times a day (QID) | ORAL | 0 refills | Status: DC
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
## 2018-04-30 NOTE — ED Notes (Signed)
Bed: WA22 Expected date:  Expected time:  Means of arrival:  Comments: Triage 6 
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
## 2018-04-30 NOTE — Discharge Instructions (Addendum)
Take the antibiotics as prescribed, try to keep your leg raises, follow-up with a primary care doctor next week to be rechecked.  Return for fever, worsening symptoms

## 2018-05-05 DIAGNOSIS — M7989 Other specified soft tissue disorders: Secondary | ICD-10-CM | POA: Diagnosis not present

## 2018-05-05 DIAGNOSIS — L03115 Cellulitis of right lower limb: Secondary | ICD-10-CM | POA: Diagnosis not present

## 2018-05-05 DIAGNOSIS — M79606 Pain in leg, unspecified: Secondary | ICD-10-CM | POA: Diagnosis not present

## 2018-05-09 DIAGNOSIS — M79606 Pain in leg, unspecified: Secondary | ICD-10-CM | POA: Diagnosis not present

## 2018-05-09 DIAGNOSIS — M7989 Other specified soft tissue disorders: Secondary | ICD-10-CM | POA: Diagnosis not present

## 2018-07-23 ENCOUNTER — Emergency Department (HOSPITAL_COMMUNITY): Payer: BLUE CROSS/BLUE SHIELD

## 2018-07-23 ENCOUNTER — Other Ambulatory Visit: Payer: Self-pay

## 2018-07-23 ENCOUNTER — Emergency Department (HOSPITAL_COMMUNITY)
Admission: EM | Admit: 2018-07-23 | Discharge: 2018-07-23 | Disposition: A | Discharge status: Home / Self Care | Payer: BLUE CROSS/BLUE SHIELD | Attending: Emergency Medicine | Admitting: Emergency Medicine

## 2018-07-23 DIAGNOSIS — Z79899 Other long term (current) drug therapy: Secondary | ICD-10-CM | POA: Insufficient documentation

## 2018-07-23 DIAGNOSIS — F17210 Nicotine dependence, cigarettes, uncomplicated: Secondary | ICD-10-CM | POA: Diagnosis not present

## 2018-07-23 DIAGNOSIS — Z23 Encounter for immunization: Secondary | ICD-10-CM | POA: Diagnosis not present

## 2018-07-23 DIAGNOSIS — M79661 Pain in right lower leg: Secondary | ICD-10-CM | POA: Diagnosis not present

## 2018-07-23 DIAGNOSIS — R2241 Localized swelling, mass and lump, right lower limb: Secondary | ICD-10-CM | POA: Diagnosis not present

## 2018-07-23 DIAGNOSIS — L02415 Cutaneous abscess of right lower limb: Secondary | ICD-10-CM | POA: Diagnosis not present

## 2018-07-23 DIAGNOSIS — L0291 Cutaneous abscess, unspecified: Secondary | ICD-10-CM

## 2018-07-23 MED ORDER — LIDOCAINE-EPINEPHRINE (PF) 2 %-1:200000 IJ SOLN
10.0000 mL | Freq: Once | INTRAMUSCULAR | Status: DC
  Filled 2018-07-23: qty 20

## 2018-07-23 MED ORDER — TETANUS-DIPHTH-ACELL PERTUSSIS 5-2.5-18.5 LF-MCG/0.5 IM SUSP
0.5000 mL | Freq: Once | INTRAMUSCULAR | Status: AC
  Administered 2018-07-23: 0.5 mL via INTRAMUSCULAR
  Filled 2018-07-23: qty 0.5

## 2018-07-23 MED ORDER — SULFAMETHOXAZOLE-TRIMETHOPRIM 800-160 MG PO TABS: 1.0000 | ORAL_TABLET | Freq: Two times a day (BID) | ORAL | 0 refills | Status: AC

## 2018-07-23 NOTE — ED Notes (Signed)
Suture cart placed outside of room. I & D tray located in suture cart w/ other necessary supplies for provider.

## 2018-07-23 NOTE — ED Triage Notes (Signed)
Pt reports to the ED with c/o leg swelling. Pt reports he has a plate in the right leg that will cut from the inside and abscess causing cellulitis.  Pt reports he believes he had a fever this morning and that the tightness runs from the shin to the knee.

## 2018-07-23 NOTE — Discharge Instructions (Signed)
You were seen in the emergency department for a skin abscess. This area was aspirated to help release the bacteria and to test it. We would like you to apply warm compresses and warm flushes to this area 4-5 times per day to help facilitate further draining as needed. We are also starting you on Bactrim an antibiotic, in order to help treat the infection.   We have prescribed you new medication(s) today. Discuss the medications prescribed today with your pharmacist as they can have adverse effects and interactions with your other medicines including over the counter and prescribed medications. Seek medical evaluation if you start to experience new or abnormal symptoms after taking one of these medicines, seek care immediately if you start to experience difficulty breathing, feeling of your throat closing, facial swelling, or rash as these could be indications of a more serious allergic reaction   Given the location of the infection we would like you to follow up with Dr. Roda Shutters tomorrow morning please call the office to make an appointment to have the area rechecked.   Return to the ER sooner for new or worsening symptoms including, but not limited to increased pain, spreading redness, fevers, inability to keep fluids down, or any other concerns that you may have.
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
## 2018-07-26 LAB — AEROBIC CULTURE W GRAM STAIN (SUPERFICIAL SPECIMEN)

## 2018-07-26 LAB — AEROBIC CULTURE  (SUPERFICIAL SPECIMEN)

## 2018-07-27 ENCOUNTER — Telehealth: Payer: Self-pay | Admitting: Emergency Medicine

## 2018-07-27 NOTE — Telephone Encounter (Signed)
Post ED Visit - Positive Culture Follow-up  Culture report reviewed by antimicrobial stewardship pharmacist: [facility] []  Enzo Bi, Pharm.D. []  Celedonio Miyamoto, Pharm.D., BCPS AQ-ID []  Garvin Fila, Pharm.D., BCPS []  Georgina Pillion, Pharm.D., BCPS []  [place], [address].D., BCPS, AAHIVP []  Estella Husk, Pharm.D., BCPS, AAHIVP []  Lysle Pearl, PharmD, BCPS []  Phillips Climes, PharmD, BCPS []  Agapito Games, PharmD, BCPS []  Verlan Friends, PharmD []  Mervyn Gay, PharmD, BCPS []  Vinnie Level, PharmD  [facility] []  Len Childs, PharmD []  Greer Pickerel, PharmD []  Adalberto Cole, PharmD []  Perlie Gold, Rph []  Lonell Face) Jean Rosenthal, PharmD []  Earl Many, PharmD []  Junita Push, PharmD []  Dorna Leitz, PharmD []  Terrilee Files, PharmD []  Lynann Beaver, PharmD []  Keturah Barre, PharmD []  Loralee Pacas, PharmD [x]  Bernadene Person, PharmD   Positive wound culture Treated with sulfamethoxazole/trimethoprim, organism sensitive to the same and no further patient follow-up is required at this time.  Carollee Herter Valyncia Wiens 07/27/2018, 4:04 PM

## 2018-07-30 ENCOUNTER — Telehealth (INDEPENDENT_AMBULATORY_CARE_PROVIDER_SITE_OTHER): Payer: Self-pay | Admitting: Orthopaedic Surgery

## 2018-07-30 ENCOUNTER — Telehealth (INDEPENDENT_AMBULATORY_CARE_PROVIDER_SITE_OTHER): Payer: Self-pay

## 2018-07-30 NOTE — Telephone Encounter (Signed)
Called patient and asked the screening questions.  Do you have now or have you had in the past 7 days a fever and/or chills? NO  Do you have now or have you had in the past 7 days a cough? NO  Do you have now or have yo0u had in the last 7 days nausea, vomiting or abdominal pain? NO  Have you been exposed to anyone who has tested positive for COVID-19? NO  Have you or anyone who lives with you traveled within the last month? NO 

## 2018-07-30 NOTE — Telephone Encounter (Signed)
Patient answered "No" to all prescreening questions.

## 2018-07-31 ENCOUNTER — Other Ambulatory Visit: Payer: Self-pay

## 2018-07-31 ENCOUNTER — Encounter (INDEPENDENT_AMBULATORY_CARE_PROVIDER_SITE_OTHER): Payer: Self-pay | Admitting: Orthopaedic Surgery

## 2018-07-31 ENCOUNTER — Ambulatory Visit (INDEPENDENT_AMBULATORY_CARE_PROVIDER_SITE_OTHER): Payer: BLUE CROSS/BLUE SHIELD | Admitting: Orthopaedic Surgery

## 2018-07-31 DIAGNOSIS — L03115 Cellulitis of right lower limb: Secondary | ICD-10-CM

## 2018-07-31 DIAGNOSIS — L02415 Cutaneous abscess of right lower limb: Secondary | ICD-10-CM | POA: Diagnosis not present

## 2018-07-31 MED ORDER — SULFAMETHOXAZOLE-TRIMETHOPRIM 800-160 MG PO TABS: 1.0000 | ORAL_TABLET | Freq: Two times a day (BID) | ORAL | 0 refills | Status: DC
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
## 2018-08-05 ENCOUNTER — Telehealth (INDEPENDENT_AMBULATORY_CARE_PROVIDER_SITE_OTHER): Payer: Self-pay

## 2018-08-05 NOTE — Telephone Encounter (Signed)
We can post it because it's infection.  Does he want to schedule it?

## 2018-08-05 NOTE — Telephone Encounter (Signed)
See message.

## 2018-08-05 NOTE — Telephone Encounter (Signed)
Will defer to xu.  Looks like guidelines may have changed today

## 2018-08-05 NOTE — Telephone Encounter (Signed)
Mom called for pt. States he is ready to schedule surgery for the hardware removal and wanted to know if this is considered elective and can wait until June or if he should go ahead and schedule.

## 2018-08-06 NOTE — Telephone Encounter (Signed)
[id number], [id number], [id number]  [facility] thanks.

## 2018-08-06 NOTE — Telephone Encounter (Signed)
Ok, sounds good.  Yes, I think he does.  Jesse Mcintosh, can you call to schedule?

## 2018-08-07 NOTE — Telephone Encounter (Signed)
I called Mom's number, got busy signal.  I called patient's number and no answer with voice mail full.

## 2018-08-18 ENCOUNTER — Encounter (HOSPITAL_BASED_OUTPATIENT_CLINIC_OR_DEPARTMENT_OTHER): Payer: Self-pay | Admitting: *Deleted

## 2018-08-18 ENCOUNTER — Other Ambulatory Visit: Payer: Self-pay

## 2018-08-18 NOTE — Telephone Encounter (Signed)
Spoke with patient and scheduled.

## 2018-08-27 ENCOUNTER — Ambulatory Visit (HOSPITAL_BASED_OUTPATIENT_CLINIC_OR_DEPARTMENT_OTHER): Payer: BLUE CROSS/BLUE SHIELD | Admitting: Anesthesiology

## 2018-08-27 ENCOUNTER — Ambulatory Visit (HOSPITAL_BASED_OUTPATIENT_CLINIC_OR_DEPARTMENT_OTHER)
Admission: RE | Admit: 2018-08-27 | Discharge: 2018-08-27 | Disposition: A | Discharge status: Home / Self Care | Payer: BLUE CROSS/BLUE SHIELD | Attending: Orthopaedic Surgery | Admitting: Orthopaedic Surgery

## 2018-08-27 ENCOUNTER — Encounter (HOSPITAL_BASED_OUTPATIENT_CLINIC_OR_DEPARTMENT_OTHER): Payer: Self-pay | Admitting: *Deleted

## 2018-08-27 ENCOUNTER — Other Ambulatory Visit: Payer: Self-pay

## 2018-08-27 ENCOUNTER — Encounter (HOSPITAL_BASED_OUTPATIENT_CLINIC_OR_DEPARTMENT_OTHER)
Admission: RE | Disposition: A | Discharge status: Home / Self Care | Payer: Self-pay | Source: Home / Self Care | Attending: Orthopaedic Surgery

## 2018-08-27 DIAGNOSIS — X58XXXA Exposure to other specified factors, initial encounter: Secondary | ICD-10-CM | POA: Insufficient documentation

## 2018-08-27 DIAGNOSIS — T847XXA Infection and inflammatory reaction due to other internal orthopedic prosthetic devices, implants and grafts, initial encounter: Secondary | ICD-10-CM

## 2018-08-27 DIAGNOSIS — T8489XA Other specified complication of internal orthopedic prosthetic devices, implants and grafts, initial encounter: Secondary | ICD-10-CM | POA: Insufficient documentation

## 2018-08-27 DIAGNOSIS — F17210 Nicotine dependence, cigarettes, uncomplicated: Secondary | ICD-10-CM | POA: Diagnosis not present

## 2018-08-27 DIAGNOSIS — L02415 Cutaneous abscess of right lower limb: Secondary | ICD-10-CM | POA: Insufficient documentation

## 2018-08-27 DIAGNOSIS — T84622A Infection and inflammatory reaction due to internal fixation device of right tibia, initial encounter: Secondary | ICD-10-CM | POA: Diagnosis not present

## 2018-08-27 DIAGNOSIS — Z8042 Family history of malignant neoplasm of prostate: Secondary | ICD-10-CM | POA: Diagnosis not present

## 2018-08-27 DIAGNOSIS — E291 Testicular hypofunction: Secondary | ICD-10-CM | POA: Diagnosis not present

## 2018-08-27 DIAGNOSIS — E611 Iron deficiency: Secondary | ICD-10-CM | POA: Diagnosis not present

## 2018-08-27 DIAGNOSIS — E559 Vitamin D deficiency, unspecified: Secondary | ICD-10-CM | POA: Diagnosis not present

## 2018-08-27 HISTORY — PX: HARDWARE REMOVAL: SHX979

## 2018-08-27 LAB — CBC WITH DIFFERENTIAL/PLATELET
Abs Immature Granulocytes: 0.02 10*3/uL (ref 0.00–0.07)
Basophils Absolute: 0.1 10*3/uL (ref 0.0–0.1)
Basophils Relative: 1 %
Eosinophils Absolute: 0.6 10*3/uL — ABNORMAL HIGH (ref 0.0–0.5)
Eosinophils Relative: 9 %
HCT: 46.6 % (ref 39.0–52.0)
Hemoglobin: 16.2 g/dL (ref 13.0–17.0)
Immature Granulocytes: 0 %
Lymphocytes Relative: 26 %
Lymphs Abs: 1.8 10*3/uL (ref 0.7–4.0)
MCH: 32.9 pg (ref 26.0–34.0)
MCHC: 34.8 g/dL (ref 30.0–36.0)
MCV: 94.5 fL (ref 80.0–100.0)
Monocytes Absolute: 0.6 10*3/uL (ref 0.1–1.0)
Monocytes Relative: 9 %
Neutro Abs: 3.8 10*3/uL (ref 1.7–7.7)
Neutrophils Relative %: 55 %
Platelets: 268 10*3/uL (ref 150–400)
RBC: 4.93 MIL/uL (ref 4.22–5.81)
RDW: 12.4 % (ref 11.5–15.5)
WBC: 6.9 10*3/uL (ref 4.0–10.5)
nRBC: 0 % (ref 0.0–0.2)

## 2018-08-27 LAB — COMPREHENSIVE METABOLIC PANEL
ALT: 244 U/L — ABNORMAL HIGH (ref 0–44)
AST: 125 U/L — ABNORMAL HIGH (ref 15–41)
Albumin: 3.8 g/dL (ref 3.5–5.0)
Alkaline Phosphatase: 71 U/L (ref 38–126)
Anion gap: 10 (ref 5–15)
BUN: 7 mg/dL (ref 6–20)
CO2: 23 mmol/L (ref 22–32)
Calcium: 8.8 mg/dL — ABNORMAL LOW (ref 8.9–10.3)
Chloride: 107 mmol/L (ref 98–111)
Creatinine, Ser: 0.98 mg/dL (ref 0.61–1.24)
GFR calc Af Amer: >60 mL/min (ref >60)
GFR calc non Af Amer: >60 mL/min (ref >60)
Glucose, Bld: 101 mg/dL — ABNORMAL HIGH (ref 70–99)
Potassium: 3.9 mmol/L (ref 3.5–5.1)
Sodium: 140 mmol/L (ref 135–145)
Total Bilirubin: 1.1 mg/dL (ref 0.3–1.2)
Total Protein: 7.1 g/dL (ref 6.5–8.1)

## 2018-08-27 LAB — SEDIMENTATION RATE: Sed Rate: 1 mm/hr (ref 0–16)

## 2018-08-27 LAB — TYPE AND SCREEN
ABO/RH(D): B NEG
Antibody Screen: NEGATIVE

## 2018-08-27 LAB — C-REACTIVE PROTEIN: CRP: <0.8 mg/dL (ref <1.0)

## 2018-08-27 SURGERY — REMOVAL, HARDWARE
Anesthesia: General | Site: Leg Lower | Laterality: Right

## 2018-08-27 MED ORDER — LACTATED RINGERS IV SOLN: INTRAVENOUS | Status: DC

## 2018-08-27 MED ORDER — CEFAZOLIN SODIUM-DEXTROSE 2-4 GM/100ML-% IV SOLN
2.0000 g | INTRAVENOUS | Status: AC
  Administered 2018-08-27: 2 g via INTRAVENOUS

## 2018-08-27 MED ORDER — OXYCODONE HCL 5 MG/5ML PO SOLN: 5.0000 mg | Freq: Once | ORAL | Status: DC | PRN

## 2018-08-27 MED ORDER — DEXMEDETOMIDINE HCL IN NACL 200 MCG/50ML IV SOLN
INTRAVENOUS | Status: DC | PRN
  Administered 2018-08-27: 25 ug via INTRAVENOUS

## 2018-08-27 MED ORDER — FENTANYL CITRATE (PF) 100 MCG/2ML IJ SOLN
INTRAMUSCULAR | Status: AC
  Filled 2018-08-27: qty 2

## 2018-08-27 MED ORDER — FENTANYL CITRATE (PF) 100 MCG/2ML IJ SOLN
25.0000 ug | INTRAMUSCULAR | Status: DC | PRN
  Administered 2018-08-27 (×2): 50 ug via INTRAVENOUS

## 2018-08-27 MED ORDER — PROPOFOL 500 MG/50ML IV EMUL
INTRAVENOUS | Status: AC
  Filled 2018-08-27: qty 50

## 2018-08-27 MED ORDER — ONDANSETRON HCL 4 MG/2ML IJ SOLN: 4.0000 mg | Freq: Once | INTRAMUSCULAR | Status: DC | PRN

## 2018-08-27 MED ORDER — EPHEDRINE 5 MG/ML INJ
INTRAVENOUS | Status: AC
  Filled 2018-08-27: qty 10

## 2018-08-27 MED ORDER — OXYCODONE-ACETAMINOPHEN 5-325 MG PO TABS: 1.0000 | ORAL_TABLET | Freq: Four times a day (QID) | ORAL | 0 refills | Status: AC | PRN

## 2018-08-27 MED ORDER — DEXAMETHASONE SODIUM PHOSPHATE 4 MG/ML IJ SOLN
INTRAMUSCULAR | Status: DC | PRN
  Administered 2018-08-27: 10 mg via INTRAVENOUS

## 2018-08-27 MED ORDER — BUPIVACAINE HCL (PF) 0.25 % IJ SOLN
INTRAMUSCULAR | Status: DC | PRN
  Administered 2018-08-27: 10 mL

## 2018-08-27 MED ORDER — MIDAZOLAM HCL 2 MG/2ML IJ SOLN
1.0000 mg | INTRAMUSCULAR | Status: DC | PRN
  Administered 2018-08-27: 2 mg via INTRAVENOUS

## 2018-08-27 MED ORDER — SUCCINYLCHOLINE CHLORIDE 200 MG/10ML IV SOSY
PREFILLED_SYRINGE | INTRAVENOUS | Status: AC
  Filled 2018-08-27: qty 10

## 2018-08-27 MED ORDER — ONDANSETRON HCL 4 MG/2ML IJ SOLN
INTRAMUSCULAR | Status: AC
  Filled 2018-08-27: qty 2

## 2018-08-27 MED ORDER — SULFAMETHOXAZOLE-TRIMETHOPRIM 800-160 MG PO TABS: 1.0000 | ORAL_TABLET | Freq: Two times a day (BID) | ORAL | 0 refills | Status: AC

## 2018-08-27 MED ORDER — ONDANSETRON HCL 4 MG PO TABS: 4.0000 mg | ORAL_TABLET | Freq: Three times a day (TID) | ORAL | 0 refills | Status: AC | PRN

## 2018-08-27 MED ORDER — PHENYLEPHRINE 40 MCG/ML (10ML) SYRINGE FOR IV PUSH (FOR BLOOD PRESSURE SUPPORT)
PREFILLED_SYRINGE | INTRAVENOUS | Status: AC
  Filled 2018-08-27: qty 10

## 2018-08-27 MED ORDER — CEFAZOLIN SODIUM-DEXTROSE 2-4 GM/100ML-% IV SOLN
INTRAVENOUS | Status: AC
  Filled 2018-08-27: qty 100

## 2018-08-27 MED ORDER — FENTANYL CITRATE (PF) 100 MCG/2ML IJ SOLN
50.0000 ug | INTRAMUSCULAR | Status: DC | PRN
  Administered 2018-08-27: 50 ug via INTRAVENOUS

## 2018-08-27 MED ORDER — PROPOFOL 10 MG/ML IV BOLUS
INTRAVENOUS | Status: DC | PRN
  Administered 2018-08-27: 300 mg via INTRAVENOUS

## 2018-08-27 MED ORDER — LIDOCAINE 2% (20 MG/ML) 5 ML SYRINGE
INTRAMUSCULAR | Status: AC
  Filled 2018-08-27: qty 5

## 2018-08-27 MED ORDER — OXYCODONE HCL 5 MG PO TABS: 5.0000 mg | ORAL_TABLET | Freq: Once | ORAL | Status: DC | PRN

## 2018-08-27 MED ORDER — LACTATED RINGERS IV SOLN
INTRAVENOUS | Status: DC
  Administered 2018-08-27 (×2): via INTRAVENOUS

## 2018-08-27 MED ORDER — CHLORHEXIDINE GLUCONATE 4 % EX LIQD: 60.0000 mL | Freq: Once | CUTANEOUS | Status: DC

## 2018-08-27 MED ORDER — DEXAMETHASONE SODIUM PHOSPHATE 10 MG/ML IJ SOLN
INTRAMUSCULAR | Status: AC
  Filled 2018-08-27: qty 1

## 2018-08-27 MED ORDER — 0.9 % SODIUM CHLORIDE (POUR BTL) OPTIME
TOPICAL | Status: DC | PRN
  Administered 2018-08-27: 08:00:00 3000 mL

## 2018-08-27 MED ORDER — ACETAMINOPHEN 325 MG PO TABS: 325.0000 mg | ORAL_TABLET | ORAL | Status: DC | PRN

## 2018-08-27 MED ORDER — ONDANSETRON HCL 4 MG/2ML IJ SOLN
INTRAMUSCULAR | Status: DC | PRN
  Administered 2018-08-27: 4 mg via INTRAVENOUS

## 2018-08-27 MED ORDER — MEPERIDINE HCL 25 MG/ML IJ SOLN: 6.2500 mg | INTRAMUSCULAR | Status: DC | PRN

## 2018-08-27 MED ORDER — LIDOCAINE HCL (CARDIAC) PF 100 MG/5ML IV SOSY
PREFILLED_SYRINGE | INTRAVENOUS | Status: DC | PRN
  Administered 2018-08-27: 50 mg via INTRAVENOUS

## 2018-08-27 MED ORDER — ACETAMINOPHEN 160 MG/5ML PO SOLN: 325.0000 mg | ORAL | Status: DC | PRN

## 2018-08-27 MED ORDER — SCOPOLAMINE 1 MG/3DAYS TD PT72: 1.0000 | MEDICATED_PATCH | Freq: Once | TRANSDERMAL | Status: DC | PRN

## 2018-08-27 MED ORDER — MIDAZOLAM HCL 2 MG/2ML IJ SOLN
INTRAMUSCULAR | Status: AC
  Filled 2018-08-27: qty 2

## 2018-08-27 SURGICAL SUPPLY — 81 items
APL SKNCLS STERI-STRIP NONHPOA (GAUZE/BANDAGES/DRESSINGS)
BANDAGE ACE 4X5 VEL STRL LF (GAUZE/BANDAGES/DRESSINGS) IMPLANT
BANDAGE ACE 6X5 VEL STRL LF (GAUZE/BANDAGES/DRESSINGS) ×2 IMPLANT
BANDAGE ESMARK 6X9 LF (GAUZE/BANDAGES/DRESSINGS) ×1 IMPLANT
BENZOIN TINCTURE PRP APPL 2/3 (GAUZE/BANDAGES/DRESSINGS) IMPLANT
BLADE HEX COATED 2.75 (ELECTRODE) IMPLANT
BLADE SURG 15 STRL LF DISP TIS (BLADE) ×2 IMPLANT
BLADE SURG 15 STRL SS (BLADE) ×2
BNDG CMPR 9X6 STRL LF SNTH (GAUZE/BANDAGES/DRESSINGS) ×1
BNDG COHESIVE 3X5 TAN STRL LF (GAUZE/BANDAGES/DRESSINGS) ×2 IMPLANT
BNDG ESMARK 6X9 LF (GAUZE/BANDAGES/DRESSINGS) ×2
CANISTER SUCT 1200ML W/VALVE (MISCELLANEOUS) ×2 IMPLANT
COVER BACK TABLE REUSABLE LG (DRAPES) ×2 IMPLANT
COVER WAND RF STERILE (DRAPES) IMPLANT
CUFF TOURN SGL QUICK 24 (TOURNIQUET CUFF)
CUFF TOURN SGL QUICK 34 (TOURNIQUET CUFF) ×2
CUFF TRNQT CYL 24X4X16.5-23 (TOURNIQUET CUFF) IMPLANT
CUFF TRNQT CYL 34X4.125X (TOURNIQUET CUFF) IMPLANT
DECANTER SPIKE VIAL GLASS SM (MISCELLANEOUS) ×1 IMPLANT
DRAPE EXTREMITY T 121X128X90 (DISPOSABLE) ×2 IMPLANT
DRAPE IMP U-DRAPE 54X76 (DRAPES) ×2 IMPLANT
DRAPE OEC MINIVIEW 54X84 (DRAPES) ×1 IMPLANT
DRAPE U-SHAPE 47X51 STRL (DRAPES) ×1 IMPLANT
DRSG PAD ABDOMINAL 8X10 ST (GAUZE/BANDAGES/DRESSINGS) ×1 IMPLANT
DURAPREP 26ML APPLICATOR (WOUND CARE) ×1 IMPLANT
ELECT REM PT RETURN 9FT ADLT (ELECTROSURGICAL) ×2
ELECTRODE REM PT RTRN 9FT ADLT (ELECTROSURGICAL) ×1 IMPLANT
GAUZE 4X4 16PLY RFD (DISPOSABLE) IMPLANT
GAUZE SPONGE 4X4 12PLY STRL (GAUZE/BANDAGES/DRESSINGS) ×2 IMPLANT
GAUZE XEROFORM 1X8 LF (GAUZE/BANDAGES/DRESSINGS) ×2 IMPLANT
GLOVE BIOGEL PI IND STRL 6.5 (GLOVE) IMPLANT
GLOVE BIOGEL PI IND STRL 7.0 (GLOVE) ×1 IMPLANT
GLOVE BIOGEL PI INDICATOR 6.5 (GLOVE) ×1
GLOVE BIOGEL PI INDICATOR 7.0 (GLOVE) ×2
GLOVE ECLIPSE 7.0 STRL STRAW (GLOVE) ×2 IMPLANT
GLOVE SKINSENSE NS SZ7.5 (GLOVE) ×1
GLOVE SKINSENSE STRL SZ7.5 (GLOVE) ×1 IMPLANT
GLOVE SURG SS PI 6.5 STRL IVOR (GLOVE) ×1 IMPLANT
GLOVE SURG SYN 7.5  E (GLOVE) ×1
GLOVE SURG SYN 7.5 E (GLOVE) ×1 IMPLANT
GLOVE SURG SYN 7.5 PF PI (GLOVE) ×1 IMPLANT
GOWN STRL REIN XL XLG (GOWN DISPOSABLE) ×2 IMPLANT
GOWN STRL REUS W/ TWL LRG LVL3 (GOWN DISPOSABLE) ×1 IMPLANT
GOWN STRL REUS W/ TWL XL LVL3 (GOWN DISPOSABLE) ×1 IMPLANT
GOWN STRL REUS W/TWL LRG LVL3 (GOWN DISPOSABLE) ×4
GOWN STRL REUS W/TWL XL LVL3 (GOWN DISPOSABLE) ×2
NEEDLE HYPO 22GX1.5 SAFETY (NEEDLE) ×1 IMPLANT
NS IRRIG 1000ML POUR BTL (IV SOLUTION) ×4 IMPLANT
PACK BASIN DAY SURGERY FS (CUSTOM PROCEDURE TRAY) ×2 IMPLANT
PAD CAST 3X4 CTTN HI CHSV (CAST SUPPLIES) IMPLANT
PAD CAST 4YDX4 CTTN HI CHSV (CAST SUPPLIES) IMPLANT
PADDING CAST COTTON 3X4 STRL (CAST SUPPLIES)
PADDING CAST COTTON 4X4 STRL (CAST SUPPLIES) ×2
PADDING CAST COTTON 6X4 STRL (CAST SUPPLIES) IMPLANT
PADDING CAST SYN 6 (CAST SUPPLIES)
PADDING CAST SYNTHETIC 4 (CAST SUPPLIES)
PADDING CAST SYNTHETIC 4X4 STR (CAST SUPPLIES) IMPLANT
PADDING CAST SYNTHETIC 6X4 NS (CAST SUPPLIES) IMPLANT
PENCIL BUTTON HOLSTER BLD 10FT (ELECTRODE) ×2 IMPLANT
SLEEVE SCD COMPRESS KNEE MED (MISCELLANEOUS) ×1 IMPLANT
SPLINT FAST PLASTER 5X30 (CAST SUPPLIES)
SPLINT FIBERGLASS 3X35 (CAST SUPPLIES) IMPLANT
SPLINT FIBERGLASS 4X30 (CAST SUPPLIES) IMPLANT
SPLINT PLASTER CAST FAST 5X30 (CAST SUPPLIES) IMPLANT
SPONGE LAP 18X18 RF (DISPOSABLE) ×1 IMPLANT
STAPLER VISISTAT (STAPLE) IMPLANT
STRIP CLOSURE SKIN 1/2X4 (GAUZE/BANDAGES/DRESSINGS) IMPLANT
SUCTION FRAZIER HANDLE 10FR (MISCELLANEOUS) ×1
SUCTION TUBE FRAZIER 10FR DISP (MISCELLANEOUS) ×1 IMPLANT
SUT ETHILON 2 0 FS 18 (SUTURE) ×2 IMPLANT
SUT ETHILON 3 0 PS 1 (SUTURE) IMPLANT
SUT MNCRL AB 4-0 PS2 18 (SUTURE) IMPLANT
SUT MON AB 2-0 CT1 36 (SUTURE) ×3 IMPLANT
SUT VIC AB 2-0 CT1 27 (SUTURE)
SUT VIC AB 2-0 CT1 TAPERPNT 27 (SUTURE) IMPLANT
SYR BULB 3OZ (MISCELLANEOUS) ×2 IMPLANT
SYR CONTROL 10ML LL (SYRINGE) ×1 IMPLANT
TOWEL GREEN STERILE FF (TOWEL DISPOSABLE) ×2 IMPLANT
TUBE CONNECTING 20X1/4 (TUBING) ×2 IMPLANT
UNDERPAD 30X30 (UNDERPADS AND DIAPERS) ×2 IMPLANT
YANKAUER SUCT BULB TIP NO VENT (SUCTIONS) IMPLANT

## 2018-08-27 NOTE — Transfer of Care (Signed)
Immediate Anesthesia Transfer of Care Note  Patient: Jesse Mcintosh  Procedure(s) Performed: RIGHT LOWER LEG HARDWARE REMOVAL (Right Leg Lower)  Patient Location: PACU  Anesthesia Type:General  Level of Consciousness: awake, alert  and oriented  Airway & Oxygen Therapy: Patient Spontanous Breathing and Patient connected to face mask oxygen  Post-op Assessment: Report given to RN and Post -op Vital signs reviewed and stable  Post vital signs: Reviewed and stable  Last Vitals:  Vitals Value Taken Time  BP    Temp    Pulse 84 08/27/2018  8:47 AM  Resp 16 08/27/2018  8:47 AM  SpO2 100 % 08/27/2018  8:47 AM  Vitals shown include unvalidated device data.  Last Pain:  Vitals:   08/27/18 0628  TempSrc: Oral  PainSc: 0-No pain      Patients Stated Pain Goal: 0 (08/27/18 9622)  Complications: No apparent anesthesia complications

## 2018-08-27 NOTE — Anesthesia Procedure Notes (Signed)
Procedure Name: LMA Insertion Date/Time: 08/27/2018 7:34 AM Performed by: Ronnette Hila, CRNA Pre-anesthesia Checklist: Patient identified, Emergency Drugs available, Suction available and Patient being monitored Patient Re-evaluated:Patient Re-evaluated prior to induction Oxygen Delivery Method: Circle system utilized Preoxygenation: Pre-oxygenation with 100% oxygen Induction Type: IV induction Ventilation: Mask ventilation without difficulty LMA: LMA inserted LMA Size: 5.0 Number of attempts: 1 Airway Equipment and Method: Bite block Placement Confirmation: positive ETCO2 Tube secured with: Tape Dental Injury: Teeth and Oropharynx as per pre-operative assessment

## 2018-08-27 NOTE — Discharge Instructions (Signed)
Postoperative instructions:  Weightbearing instructions: as tolerated  Keep your dressing and/or splint clean and dry at all times.  You can remove your dressing on post-operative day #3 and change with a dry/sterile dressing or Band-Aids as needed thereafter.    Incision instructions:  Do not soak your incision for 3 weeks after surgery.  If the incision gets wet, pat dry and do not scrub the incision.  Pain control:  You have been given a prescription to be taken as directed for post-operative pain control.  In addition, elevate the operative extremity above the heart at all times to prevent swelling and throbbing pain.  Take over-the-counter Colace, 100mg  by mouth twice a day while taking narcotic pain medications to help prevent constipation.  Follow up appointments: 1) 12-14 days for suture removal and wound check. 2) Dr. Roda ShuttersXu as scheduled.   -------------------------------------------------------------------------------------------------------------  After Surgery Pain Control:  After your surgery, post-surgical discomfort or pain is likely. This discomfort can last several days to a few weeks. At certain times of the day your discomfort may be more intense.  Did you receive a nerve block?  A nerve block can provide pain relief for one hour to two days after your surgery. As long as the nerve block is working, you will experience little or no sensation in the area the surgeon operated on.  As the nerve block wears off, you will begin to experience pain or discomfort. It is very important that you begin taking your prescribed pain medication before the nerve block fully wears off. Treating your pain at the first sign of the block wearing off will ensure your pain is better controlled and more tolerable when full-sensation returns. Do not wait until the pain is intolerable, as the medicine will be less effective. It is better to treat pain in advance than to try and catch up.  General  Anesthesia:  If you did not receive a nerve block during your surgery, you will need to start taking your pain medication shortly after your surgery and should continue to do so as prescribed by your surgeon.  Pain Medication:  Most commonly we prescribe Vicodin and Percocet for post-operative pain. Both of these medications contain a combination of acetaminophen (Tylenol) and a narcotic to help control pain.   It takes between 30 and 45 minutes before pain medication starts to work. It is important to take your medication before your pain level gets too intense.   Nausea is a common side effect of many pain medications. You will want to eat something before taking your pain medicine to help prevent nausea.   If you are taking a prescription pain medication that contains acetaminophen, we recommend that you do not take additional over the counter acetaminophen (Tylenol).  Other pain relieving options:   Using a cold pack to ice the affected area a few times a day (15 to 20 minutes at a time) can help to relieve pain, reduce swelling and bruising.   Elevation of the affected area can also help to reduce pain and swelling.      Post Anesthesia Home Care Instructions  Activity: Get plenty of rest for the remainder of the day. A responsible individual must stay with you for 24 hours following the procedure.  For the next 24 hours, DO NOT: -Drive a car -Advertising copywriterperate machinery -Drink alcoholic beverages -Take any medication unless instructed by your physician -Make any legal decisions or sign important papers.  Meals: Start with liquid foods such as gelatin  or soup. Progress to regular foods as tolerated. Avoid greasy, spicy, heavy foods. If nausea and/or vomiting occur, drink only clear liquids until the nausea and/or vomiting subsides. Call your physician if vomiting continues.  Special Instructions/Symptoms: Your throat may feel dry or sore from the anesthesia or the breathing tube  placed in your throat during surgery. If this causes discomfort, gargle with warm salt water. The discomfort should disappear within 24 hours.

## 2018-08-27 NOTE — Anesthesia Preprocedure Evaluation (Signed)
Anesthesia Evaluation  Patient identified by MRN, date of birth, ID band Patient awake    Reviewed: Allergy & Precautions, H&P , NPO status , Patient's Chart, lab work & pertinent test results  Airway Mallampati: II  TM Distance: >3 FB Neck ROM: Full    Dental no notable dental hx. (+) Teeth Intact   Pulmonary Current Smoker,    Pulmonary exam normal breath sounds clear to auscultation       Cardiovascular negative cardio ROS Normal cardiovascular exam Rhythm:Regular Rate:Normal     Neuro/Psych negative neurological ROS  negative psych ROS   GI/Hepatic negative GI ROS, (+)     substance abuse  ,   Endo/Other  negative endocrine ROS  Renal/GU negative Renal ROS     Musculoskeletal negative musculoskeletal ROS (+)   Abdominal   Peds  Hematology negative hematology ROS (+)   Anesthesia Other Findings H/o Polysubstance abuse  Nicotine dependence    Reproductive/Obstetrics                             Anesthesia Physical  Anesthesia Plan  ASA: II  Anesthesia Plan: General   Post-op Pain Management:    Induction: Intravenous  PONV Risk Score and Plan: Ondansetron and Treatment may vary due to age or medical condition  Airway Management Planned: Oral ETT and LMA  Additional Equipment:   Intra-op Plan:   Post-operative Plan: Extubation in OR  Informed Consent: I have reviewed the patients History and Physical, chart, labs and discussed the procedure including the risks, benefits and alternatives for the proposed anesthesia with the patient or authorized representative who has indicated his/her understanding and acceptance.     Dental advisory given  Plan Discussed with: CRNA, Anesthesiologist and Surgeon  Anesthesia Plan Comments:         Anesthesia Quick Evaluation

## 2018-08-27 NOTE — H&P (Signed)
PREOPERATIVE H&P  Chief Complaint: right lower leg infection and retained hardware  HPI: Jesse Mcintosh is a 43 y.o. male who presents for surgical treatment of right lower leg infection and retained hardware.  He denies any changes in medical history.  Past Medical History:  Diagnosis Date  . Fracture, tibial plateau, left bicondylar  06/19/2014  . Iron deficiency 06/24/2014  . Nicotine dependence 06/24/2014  . Polysubstance abuse (HCC)   . Testosterone deficiency 06/24/2014  . Vitamin D deficiency 06/24/2014   Past Surgical History:  Procedure Laterality Date  . FRACTURE SURGERY    . ORIF TIBIA PLATEAU Left 06/22/2014   Procedure: OPEN REDUCTION INTERNAL FIXATION (ORIF) LEFT TIBIAL PLATEAU;  Surgeon: Budd PalmerMichael H Handy, MD;  Location: [facility] OR;  Service: Orthopedics;  Laterality: Left;   Social History   Socioeconomic History  . Marital status: Single    Spouse name: Not on file  . Number of children: Not on file  . Years of education: Not on file  . Highest education level: Not on file  Occupational History  . Not on file  Social Needs  . Financial resource strain: Not on file  . Food insecurity:    Worry: Not on file    Inability: Not on file  . Transportation needs:    Medical: Not on file    Non-medical: Not on file  Tobacco Use  . Smoking status: Light Tobacco Smoker    Packs/day: 0.50    Types: Cigarettes  . Smokeless tobacco: Never Used  Substance and Sexual Activity  . Alcohol use: Yes    Alcohol/week: 3.0 standard drinks    Types: 3 Standard drinks or equivalent per week    Comment: occasional  . Drug use: Not Currently    Types: Marijuana, Amphetamines, Cocaine, Benzodiazepines    Comment: Not used in over a year 08/18/18  . Sexual activity: Not on file  Lifestyle  . Physical activity:    Days per week: Not on file    Minutes per session: Not on file  . Stress: Not on file  Relationships  . Social connections:    Talks on phone: Not on file    Gets  together: Not on file    Attends religious service: Not on file    Active member of club or organization: Not on file    Attends meetings of clubs or organizations: Not on file    Relationship status: Not on file  Other Topics Concern  . Not on file  Social History Narrative  . Not on file   Family History  Problem Relation Age of Onset  . Cancer Father        prostate   No Known Allergies Prior to Admission medications   Not on File     Positive ROS: All other systems have been reviewed and were otherwise negative with the exception of those mentioned in the HPI and as above.  Physical Exam: General: Alert, no acute distress Cardiovascular: No pedal edema Respiratory: No cyanosis, no use of accessory musculature GI: abdomen soft Skin: No lesions in the area of chief complaint Neurologic: Sensation intact distally Psychiatric: Patient is competent for consent with normal mood and affect Lymphatic: no lymphedema  MUSCULOSKELETAL: exam stable  Assessment: right lower leg infection and retained hardware  Plan: Plan for Procedure(s): RIGHT LOWER LEG HARDWARE REMOVAL  The risks benefits and alternatives were discussed with the patient including but not limited to the risks of nonoperative treatment, versus  surgical intervention including infection, bleeding, nerve injury,  blood clots, cardiopulmonary complications, morbidity, mortality, among others, and they were willing to proceed.   Glee Arvin, MD   08/27/2018 7:18 AM

## 2018-08-27 NOTE — Op Note (Signed)
   Date of Surgery: 08/27/2018  INDICATIONS: Mr. 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  Recommendation was for irrigation debridement of the draining sinus and removal of the hardware.  The patient did consent to the procedure after discussion of the risks and benefits.  PREOPERATIVE DIAGNOSIS: Symptomatic hardware of right proximal tibia with recurrent abscess formation and draining sinus  POSTOPERATIVE DIAGNOSIS: Same.  PROCEDURE:  1.  Irrigation and debridement of right proximal tibia including skin, subcutaneous tissue, periosteum, bone, muscle 72 cm 2.  Removal of medial tibial plateau plate and associated 5 screws 3.  Adjacent tissue rearrangement right lower leg 2 cm  SURGEON: N. Glee Arvin, M.D.  ASSIST: Starlyn Skeans [place], [place]; necessary for the timely completion of procedure and due to complexity of procedure.  ANESTHESIA:  general, local  IV FLUIDS AND URINE: See anesthesia.  ESTIMATED BLOOD LOSS: Minimal mL.  IMPLANTS: None  DRAINS: None  COMPLICATIONS: None.  DESCRIPTION OF PROCEDURE: The patient was brought to the operating room and placed supine on the operating table.  The patient had been signed prior to the procedure and this was documented. The patient had the anesthesia placed by the anesthesiologist.  A time-out was performed to confirm that this was the correct patient, site, side and location. The patient did receive antibiotics after cultures were taken.  A tourniquet was placed.  The patient had the operative extremity prepped and draped in the standard surgical fashion.    I made an elliptical incision around the draining sinus and a full-thickness manner and carried this incision proximally through the  previously healed surgical scar.  Dissection was performed through the subcutaneous tissue.  I continued my dissection down onto the hardware which was quite prominent.  Identified the draining sinus and this was ellipsed out completely with a 15 blade.  I then elevated the soft tissue off of the hardware in order to expose entirely.  The screws were then removed without difficulty along with the plate.  Gentle debridement of the screw holes was then performed using a rondure.  I then performed additional excisional debridement of the periosteum and bone that communicated with the draining sinus.  Cultures were obtained of the periosteum and bone and soft tissue.  After thorough debridement the wound was then thoroughly irrigated with 3 L of normal saline.  The tourniquet was deflated and hemostasis was obtained.  Adjacent tissue rearrangement was then performed in order to approximate the skin using 2-0 Monocryl for the deep layer and 2-0 nylon for the skin.  Local anesthesia was infiltrated.  Sterile dressings were applied.  Patient tolerated the procedure well had no immediate complications.  POSTOPERATIVE PLAN: Patient will be discharged home.  We will follow-up on the culture results.  Mayra Reel, MD [facility] [phone] 8:28 AM

## 2018-08-27 NOTE — Anesthesia Postprocedure Evaluation (Signed)
Anesthesia Post Note  Patient: LYNKON PELLER  Procedure(s) Performed: RIGHT LOWER LEG HARDWARE REMOVAL (Right Leg Lower)     Patient location during evaluation: PACU Anesthesia Type: General Level of consciousness: awake and alert Pain management: pain level controlled Vital Signs Assessment: post-procedure vital signs reviewed and stable Respiratory status: spontaneous breathing, nonlabored ventilation, respiratory function stable and patient connected to nasal cannula oxygen Cardiovascular status: blood pressure returned to baseline and stable Postop Assessment: no apparent nausea or vomiting Anesthetic complications: no    Last Vitals:  Vitals:   08/27/18 0915 08/27/18 0940  BP: (!) 118/93 (!) 135/98  Pulse: 71 74  Resp: 20 16  Temp:  36.4 C  SpO2: 99% 99%    Last Pain:  Vitals:   08/27/18 0940  TempSrc:   PainSc: 3                  Vershawn Westrup

## 2018-08-28 ENCOUNTER — Encounter (HOSPITAL_BASED_OUTPATIENT_CLINIC_OR_DEPARTMENT_OTHER): Payer: Self-pay | Admitting: Orthopaedic Surgery

## 2018-09-01 LAB — AEROBIC/ANAEROBIC CULTURE W GRAM STAIN (SURGICAL/DEEP WOUND): Culture: NO GROWTH

## 2018-09-10 ENCOUNTER — Ambulatory Visit (INDEPENDENT_AMBULATORY_CARE_PROVIDER_SITE_OTHER): Payer: BLUE CROSS/BLUE SHIELD | Admitting: Physician Assistant

## 2018-09-10 ENCOUNTER — Other Ambulatory Visit: Payer: Self-pay

## 2018-09-10 ENCOUNTER — Encounter: Payer: Self-pay | Admitting: Orthopaedic Surgery

## 2018-09-10 DIAGNOSIS — Z9889 Other specified postprocedural states: Secondary | ICD-10-CM
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
## 2018-10-08 ENCOUNTER — Ambulatory Visit: Payer: BLUE CROSS/BLUE SHIELD | Admitting: Orthopaedic Surgery

## 2020-11-08 ENCOUNTER — Emergency Department (HOSPITAL_BASED_OUTPATIENT_CLINIC_OR_DEPARTMENT_OTHER): Payer: BC Managed Care – PPO

## 2020-11-08 ENCOUNTER — Emergency Department (HOSPITAL_BASED_OUTPATIENT_CLINIC_OR_DEPARTMENT_OTHER)
Admission: EM | Admit: 2020-11-08 | Discharge: 2020-11-08 | Disposition: A | Discharge status: Home / Self Care | Payer: BC Managed Care – PPO | Attending: Emergency Medicine | Admitting: Emergency Medicine

## 2020-11-08 ENCOUNTER — Other Ambulatory Visit: Payer: Self-pay

## 2020-11-08 ENCOUNTER — Encounter (HOSPITAL_BASED_OUTPATIENT_CLINIC_OR_DEPARTMENT_OTHER): Payer: Self-pay | Admitting: Emergency Medicine

## 2020-11-08 DIAGNOSIS — S0181XA Laceration without foreign body of other part of head, initial encounter: Secondary | ICD-10-CM | POA: Diagnosis not present

## 2020-11-08 DIAGNOSIS — W01198A Fall on same level from slipping, tripping and stumbling with subsequent striking against other object, initial encounter: Secondary | ICD-10-CM | POA: Diagnosis not present

## 2020-11-08 DIAGNOSIS — S0990XA Unspecified injury of head, initial encounter: Secondary | ICD-10-CM | POA: Diagnosis present

## 2020-11-08 DIAGNOSIS — F17210 Nicotine dependence, cigarettes, uncomplicated: Secondary | ICD-10-CM | POA: Diagnosis not present

## 2020-11-08 DIAGNOSIS — R42 Dizziness and giddiness: Secondary | ICD-10-CM | POA: Diagnosis not present

## 2020-11-08 LAB — BASIC METABOLIC PANEL
Anion gap: 13 (ref 5–15)
BUN: 5 mg/dL — ABNORMAL LOW (ref 6–20)
CO2: 22 mmol/L (ref 22–32)
Calcium: 9.1 mg/dL (ref 8.9–10.3)
Chloride: 103 mmol/L (ref 98–111)
Creatinine, Ser: 0.87 mg/dL (ref 0.61–1.24)
GFR, Estimated: >60 mL/min (ref >60)
Glucose, Bld: 99 mg/dL (ref 70–99)
Potassium: 3.9 mmol/L (ref 3.5–5.1)
Sodium: 138 mmol/L (ref 135–145)

## 2020-11-08 LAB — CBC
HCT: 48.4 % (ref 39.0–52.0)
Hemoglobin: 17.1 g/dL — ABNORMAL HIGH (ref 13.0–17.0)
MCH: 34.1 pg — ABNORMAL HIGH (ref 26.0–34.0)
MCHC: 35.3 g/dL (ref 30.0–36.0)
MCV: 96.6 fL (ref 80.0–100.0)
Platelets: 307 10*3/uL (ref 150–400)
RBC: 5.01 MIL/uL (ref 4.22–5.81)
RDW: 12 % (ref 11.5–15.5)
WBC: 8.1 10*3/uL (ref 4.0–10.5)
nRBC: 0 % (ref 0.0–0.2)

## 2020-11-08 LAB — URINALYSIS, ROUTINE W REFLEX MICROSCOPIC
Bilirubin Urine: NEGATIVE
Glucose, UA: NEGATIVE mg/dL
Hgb urine dipstick: NEGATIVE
Ketones, ur: NEGATIVE mg/dL
Leukocytes,Ua: NEGATIVE
Nitrite: NEGATIVE
Protein, ur: NEGATIVE mg/dL
Specific Gravity, Urine: <1.005 — ABNORMAL LOW (ref 1.005–1.030)
pH: 5.5 (ref 5.0–8.0)

## 2020-11-08 LAB — CBG MONITORING, ED: Glucose-Capillary: 115 mg/dL — ABNORMAL HIGH (ref 70–99)

## 2020-11-08 MED ORDER — LIDOCAINE-EPINEPHRINE (PF) 2 %-1:200000 IJ SOLN
10.0000 mL | Freq: Once | INTRAMUSCULAR | Status: AC
  Administered 2020-11-08: 10 mL via INTRADERMAL
  Filled 2020-11-08: qty 20

## 2020-11-08 NOTE — ED Notes (Signed)
Patient states "I would like to skip CT and be discharged." Provider notified. While standing in hallway patient pulls out his IV. Patient given discharge papers by this nurse.

## 2020-11-08 NOTE — Discharge Instructions (Addendum)
Sutures out in 4 to 6 days
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
## 2020-11-13 ENCOUNTER — Encounter (HOSPITAL_BASED_OUTPATIENT_CLINIC_OR_DEPARTMENT_OTHER): Payer: Self-pay

## 2020-11-13 ENCOUNTER — Other Ambulatory Visit: Payer: Self-pay

## 2020-11-13 ENCOUNTER — Emergency Department (HOSPITAL_BASED_OUTPATIENT_CLINIC_OR_DEPARTMENT_OTHER)
Admission: EM | Admit: 2020-11-13 | Discharge: 2020-11-13 | Disposition: A | Discharge status: Home / Self Care | Payer: BC Managed Care – PPO | Attending: Emergency Medicine | Admitting: Emergency Medicine

## 2020-11-13 DIAGNOSIS — S0181XD Laceration without foreign body of other part of head, subsequent encounter: Secondary | ICD-10-CM | POA: Insufficient documentation

## 2020-11-13 DIAGNOSIS — Z4802 Encounter for removal of sutures: Secondary | ICD-10-CM

## 2020-11-13 DIAGNOSIS — W228XXD Striking against or struck by other objects, subsequent encounter: Secondary | ICD-10-CM | POA: Insufficient documentation

## 2020-11-13 DIAGNOSIS — F17210 Nicotine dependence, cigarettes, uncomplicated: Secondary | ICD-10-CM | POA: Diagnosis not present
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
## 2020-11-13 NOTE — Discharge Instructions (Addendum)
Avoid strong sun exposure.  Bacitracin ointment.  Return if any worsening or concerning symptoms

## 2020-11-13 NOTE — ED Notes (Signed)
11 sutures removed from L forehead. Bacitracin applied. Pt tol well

## 2020-11-13 NOTE — ED Triage Notes (Signed)
He states he is here to have (upper left forehead) sutures removed. He tells me they have been in for 5 days. The wound is completely approximated with minimal erythema and no drainage.

## 2020-12-22 ENCOUNTER — Encounter (HOSPITAL_COMMUNITY): Payer: Self-pay

## 2020-12-22 ENCOUNTER — Emergency Department (HOSPITAL_COMMUNITY)
Admission: EM | Admit: 2020-12-22 | Discharge: 2020-12-22 | Disposition: A | Discharge status: Home / Self Care | Payer: BC Managed Care – PPO | Attending: Emergency Medicine | Admitting: Emergency Medicine

## 2020-12-22 ENCOUNTER — Emergency Department (HOSPITAL_COMMUNITY): Payer: BC Managed Care – PPO

## 2020-12-22 ENCOUNTER — Other Ambulatory Visit: Payer: Self-pay

## 2020-12-22 DIAGNOSIS — Y92410 Unspecified street and highway as the place of occurrence of the external cause: Secondary | ICD-10-CM | POA: Diagnosis not present

## 2020-12-22 DIAGNOSIS — F17210 Nicotine dependence, cigarettes, uncomplicated: Secondary | ICD-10-CM | POA: Diagnosis not present

## 2020-12-22 DIAGNOSIS — M542 Cervicalgia: Secondary | ICD-10-CM | POA: Insufficient documentation
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
## 2020-12-22 NOTE — ED Notes (Signed)
An After Visit Summary was printed and given to the patient. Discharge instructions given and no further questions at this time.  Pt leaving with [employer].

## 2021-10-23 ENCOUNTER — Encounter (HOSPITAL_COMMUNITY): Payer: Self-pay

## 2021-10-23 ENCOUNTER — Emergency Department (HOSPITAL_COMMUNITY)
Admission: EM | Admit: 2021-10-23 | Discharge: 2021-10-23 | Payer: BC Managed Care – PPO | Attending: Emergency Medicine | Admitting: Emergency Medicine

## 2021-10-23 ENCOUNTER — Emergency Department (HOSPITAL_COMMUNITY): Payer: BC Managed Care – PPO

## 2021-10-23 ENCOUNTER — Other Ambulatory Visit: Payer: Self-pay

## 2021-10-23 DIAGNOSIS — M545 Low back pain, unspecified: Secondary | ICD-10-CM | POA: Diagnosis present

## 2021-10-23 MED ORDER — ACETAMINOPHEN 325 MG PO TABS: 650.0000 mg | ORAL_TABLET | Freq: Once | ORAL | Status: DC

## 2021-10-23 MED ORDER — IBUPROFEN 200 MG PO TABS
600.0000 mg | ORAL_TABLET | Freq: Once | ORAL | Status: AC
  Administered 2021-10-23: 600 mg via ORAL
  Filled 2021-10-23: qty 3
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
